# Patient Record
Sex: Female | Born: 1975 | Hispanic: Yes | Marital: Married | Smoking: Never smoker
Health system: Southern US, Community
[De-identification: ages and names within clinical notes are randomized; demographics above are authoritative.]

## PROBLEM LIST (undated history)

## (undated) ENCOUNTER — Inpatient Hospital Stay (HOSPITAL_COMMUNITY): Payer: Self-pay

## (undated) DIAGNOSIS — O24419 Gestational diabetes mellitus in pregnancy, unspecified control: Secondary | ICD-10-CM

---

## 1999-03-30 ENCOUNTER — Ambulatory Visit (HOSPITAL_COMMUNITY): Admission: RE | Admit: 1999-03-30 | Discharge: 1999-03-30 | Payer: Self-pay | Admitting: *Deleted

## 1999-03-30 ENCOUNTER — Encounter: Payer: Self-pay | Admitting: *Deleted

## 1999-06-22 ENCOUNTER — Ambulatory Visit (HOSPITAL_COMMUNITY): Admission: RE | Admit: 1999-06-22 | Discharge: 1999-06-22 | Payer: Self-pay | Admitting: *Deleted

## 1999-08-27 ENCOUNTER — Inpatient Hospital Stay (HOSPITAL_COMMUNITY): Admission: AD | Admit: 1999-08-27 | Discharge: 1999-08-27 | Payer: Self-pay | Admitting: *Deleted

## 1999-10-14 ENCOUNTER — Inpatient Hospital Stay (HOSPITAL_COMMUNITY): Admission: AD | Admit: 1999-10-14 | Discharge: 1999-10-16 | Payer: Self-pay | Admitting: Obstetrics

## 1999-10-15 ENCOUNTER — Encounter: Payer: Self-pay | Admitting: Obstetrics

## 2002-11-10 ENCOUNTER — Encounter: Payer: Self-pay | Admitting: Internal Medicine

## 2002-11-10 ENCOUNTER — Ambulatory Visit (HOSPITAL_COMMUNITY): Admission: RE | Admit: 2002-11-10 | Discharge: 2002-11-10 | Payer: Self-pay | Admitting: Internal Medicine

## 2006-11-26 ENCOUNTER — Inpatient Hospital Stay (HOSPITAL_COMMUNITY): Admission: AD | Admit: 2006-11-26 | Discharge: 2006-11-26 | Payer: Self-pay | Admitting: Obstetrics & Gynecology

## 2007-02-13 ENCOUNTER — Ambulatory Visit (HOSPITAL_COMMUNITY): Admission: RE | Admit: 2007-02-13 | Discharge: 2007-02-13 | Payer: Self-pay | Admitting: Obstetrics & Gynecology

## 2007-07-09 ENCOUNTER — Inpatient Hospital Stay (HOSPITAL_COMMUNITY): Admission: AD | Admit: 2007-07-09 | Discharge: 2007-07-12 | Payer: Self-pay | Admitting: Obstetrics and Gynecology

## 2007-07-09 ENCOUNTER — Ambulatory Visit: Payer: Self-pay | Admitting: Obstetrics and Gynecology

## 2007-07-10 ENCOUNTER — Encounter: Payer: Self-pay | Admitting: Obstetrics and Gynecology

## 2010-12-18 LAB — CBC
HCT: 29.4 — ABNORMAL LOW
HCT: 30.5 — ABNORMAL LOW
Hemoglobin: 10.4 — ABNORMAL LOW
MCHC: 35.6
Platelets: 173
RBC: 3.38 — ABNORMAL LOW
RBC: 3.9
RDW: 14.8
WBC: 10.9 — ABNORMAL HIGH
WBC: 12 — ABNORMAL HIGH

## 2010-12-18 LAB — RPR: RPR Ser Ql: NONREACTIVE

## 2011-01-04 LAB — COMPREHENSIVE METABOLIC PANEL
Alkaline Phosphatase: 27 — ABNORMAL LOW
BUN: 9
Glucose, Bld: 105 — ABNORMAL HIGH
Potassium: 3.8
Total Bilirubin: 0.6
Total Protein: 6.5

## 2011-01-04 LAB — WET PREP, GENITAL

## 2011-01-04 LAB — URINALYSIS, ROUTINE W REFLEX MICROSCOPIC
Glucose, UA: NEGATIVE
Specific Gravity, Urine: 1.025
pH: 6

## 2011-01-04 LAB — CBC
HCT: 34.9 — ABNORMAL LOW
Hemoglobin: 12.5
RDW: 13.3

## 2011-01-04 LAB — URINE CULTURE: Colony Count: 50000

## 2011-01-04 LAB — GC/CHLAMYDIA PROBE AMP, GENITAL
Chlamydia, DNA Probe: NEGATIVE
GC Probe Amp, Genital: NEGATIVE

## 2012-03-25 NOTE — L&D Delivery Note (Signed)
Operative Delivery Note At 9:38 AM a viable female was delivered via Vaginal, Vacuum Investment banker, operational).  Presentation: vertex OT; Position: Left,, Occiput,, Transverse; Station: +2.  Verbal consent: obtained from patient with translator spanish.  Risks and benefits discussed in detail.  Risks include, but are not limited to the risks of anesthesia, bleeding, infection, damage to maternal tissues, fetal cephalhematoma.  There is also the risk of inability to effect vaginal delivery of the head, or shoulder dystocia that cannot be resolved by established maneuvers, leading to the need for emergency cesarean section.  APGAR: 9, 9; weight 7 lb 7.1 oz (3375 g).   Placenta status: Intact, Spontaneous.   Cord: 3 vessels with the following complications: None.    Anesthesia: Epidural  Instruments: Kiwi vacuum Episiotomy: None Lacerations: 2nd degree;Perineal Suture Repair: 3.0 vicryl on CT Est. Blood Loss (mL): 350  VAVD over 2nd degree tear with kiwi. Infant having fetal distress. Reduced cervix. Placed kiwi. 3 pulls with 1 pop off delivered vaginally and crying at perineum. Rectum evaluated with no deffects appreciated. 2nd degree repaired in usual fashion. Pt was hemostatic with EBL at 350. Counts correct. Sharps off field.   Mom to postpartum.  Baby to couplet care with mother.  Jolyn Lent RYAN 01/22/2013, 10:45 AM

## 2012-07-01 ENCOUNTER — Ambulatory Visit: Payer: Self-pay | Admitting: Physician Assistant

## 2012-07-01 VITALS — BP 112/60 | HR 65 | Temp 99.2°F | Resp 16 | Wt 165.0 lb

## 2012-07-01 DIAGNOSIS — N912 Amenorrhea, unspecified: Secondary | ICD-10-CM

## 2012-07-01 LAB — POCT URINE PREGNANCY: Preg Test, Ur: POSITIVE

## 2012-07-01 MED ORDER — PRENATAL VITAMINS 28-0.8 MG PO TABS: 1.0000 | ORAL_TABLET | Freq: Every day | ORAL | Status: DC

## 2012-07-01 NOTE — Patient Instructions (Addendum)
Your pregnancy test is positive today.  I have sent prenatal vitamins to your pharmacy, begin taking these once daily.  Do not smoke.  Do not drink alcohol.  Make sure you are eating plenty of vegetables and fruits and lean meats.  Drink plenty of water.  Establish care with an OB/GYN.  Please let us know if anything comes up in the mean time.   Psychiatrist (Pregnancy) Si planea quedar embarazada, es una buena idea concertar una cita de preconcepcin con el mdico para poder lograr un estilo de vida saludable ante de quedar embarazada. Esto incluye dieta, peso, ejercicio, el tomar vitaminas prenatales en especial cido flico (ayuda a prevenir defectos en el cerebro y la mdula espinal), evitar el alcohol, fumar, las drogas ilegales, problemas mdicos (diabetes, convulsiones), historial familiar de problemas genticos, condiciones de trabajo e inmunizaciones. Es mejor tener conocimiento de estas cosas y Radio producer algo antes de quedar embarazada. Si est embarazada, es necesario que siga ciertas pautas para tener un beb sano. Es muy importante Education officer, environmental controles prenatales adecuados y seguir las indicaciones del profesional que la asiste. La atencin prenatal incluye toda la asistencia mdica que usted recibe antes del nacimiento del beb. Esto ayuda a Publishing copy y [place]. INSTRUCCIONES PARA EL CUIDADO DOMICILIARIO  Comience las consultas prenatales alrededor de la 12 semana de embarazo o lo antes posible. Al principio generalmente se programan cada mes. Se hacen ms frecuentes en los 2 ltimos meses antes del parto. Es importante que concurra a todas las citas con el profesional y siga sus instrucciones con Camera operator a los medicamentos que deba Chemical engineer, a la actividad fsica y a Psychologist, forensic.  Durante el embarazo debe obtener nutrientes para usted y para su beb. Consuma una dieta normal y bien balanceada. Elija alimentos como carne, pescado, [place] y otros productos lcteos,  vegetales, frutas, panes integrales y Research officer, trade union cul es el aumento de [place] ideal, segn su peso y Risk analyst. Beba gran cantidad de lquidos. Trate de beber 8 vasos de lquidos por Futures trader.  El alcohol se asocia a cierto nmero de defectos del nacimiento, incluyendo el sndrome de alcoholismo fetal. Lo mejor es evitarlo completamente El cigarrillo causa nacimientos prematuros y bebs de bajo peso al Psychologist, clinical. El consumo de alcohol y nicotina durante el embarazo tambin aumentan marcadamente la probabilidad de que el nio sea qumicamente dependiente en etapas posteriores de su vida y puede contribuir al sndrome de muerte sbita infantil (SMSI)  No consuma drogas.  Solo tome medicamentos prescriptos o de venta libre que le haya recomendado el profesional. Algunos medicamentos pueden causar problemas genticos y fsicos al beb  Las nuseas matinales pueden aliviarse si come Chiropodist en la cama. Coma dos galletitas antes de levantarse por la maana.  Las relaciones sexuales pueden continuarse hasta casi el final del embarazo, si no se presentan otros problemas como prdida prematura (antes de tiempo) de lquido amnitico, Restaurant manager, fast food vaginal, dolor durante las relaciones sexuales o dolor abdominal (en el vientre).  Practique ejercicios con regularidad. Consulte con el profesional que la asiste si no sabe con certeza si determinados ejercicios son seguros.  No utilice la baera con agua caliente, baos turcos y saunas. Estos aumentan el riesgo de sufrir un desmayo o de prdida del conocimiento, y as Runner, broadcasting/film/video usted o el beb. La natacin es un buen ejercicio. Descanse todo lo que pueda e incluya una siesta despus de almorzar siempre que le sea posible, especialmente durante el tercer trimestre.  Evite los olores y las sustancias qumicas txicas.  No use zapatos de tacones altos, podra perder el equilibrio y caer.  No levante objetos de ms de 2,5 kg.  Si levanta un objeto, flexione las piernas y los muslos, y no la espalda.  Evite los viajes largos, Paramedic trimestre.  Si debe viajar fuera de la ciudad o de su estado, lleve una copia de la historia clnica. SOLICITE ATENCIN MDICA DE INMEDIATO SI:  La temperatura oral se eleva sin motivo por encima de 102 F (38.9 C) o segn le indique el profesional que lo asiste.  Tiene una prdida de lquido por la vagina. Si sospecha una ruptura de las [place], tmese la temperatura y llame al profesional para informarlo sobre esto.  Observa unas pequeas manchas o una hemorragia vaginal Notifique al profesional acerca de la cantidad y de cuntos apsitos est utilizando.  Contina teniendo nuseas y no obtiene alivio de los [employer] han [place], o vomita sangre o una sustancia similar a la borra del caf.  Presenta un dolor en la zona superior del abdomen.  Siente molestias en el ligamento redondo en la parte abdominal baja. El profesional que la asiste Hydrologist.  Siente pequeas contracciones del tero (matriz)  No siente que el beb se mueve, o percibe menos movimientos que antes.  Siente dolor al [employer].  Brett Fairy hemorragia vaginal anormal.  Tiene diarrea persistente.  Sufre una cefalea grave.  Tiene problemas visuales.  Comienza a sentir debilidad muscular.  Se siente mareada o sufre un desmayo.  Comienza a sentir falta de aire.  Siente dolor en el pecho.  Sufre dolor en la espalda que se irradia hacia la pierna y el pie.  Siente latidos cardacos irregulares o la frecuencia cardaca es muy rpida.  Aumenta excesivamente de peso en un perodo breve (2,5 kg en 3 a 5 das)  Se ve envuelta en una situacin de violencia domstica. Document Released: 12/19/2004 Document Revised: 06/03/2011 [facility] Patient Information 2013 [place], [place].

## 2012-07-01 NOTE — Progress Notes (Signed)
  Subjective:    Patient ID: Kirsten Sanchez, female    DOB: [birth year], 37 y.o.   MRN: [id number]  HPI   Kirsten Sanchez is a 37 yr old female here desiring pregnancy test.  Pt speaks little english, Fonda Kinder (clerical team member) assisted with translation.  Pt states she took a home preg test about 3 weeks ago, but the line was not very clear, so she does not know if it was positive or negative.  States she was not trying to get pregnant, but was also not using contraception.  Chart states pt has IUD, but pt states this is incorrect.  She had Implanon but this was removed 1 yr ago.  Has had abnormal periods since removal of Implanon.  Does not know LMP, maybe January.  Has felt very fatigued and had nausea but no vomiting for the last 4 weeks.  She has three children.  No OB/GYN.  Trying to establish at a clinic but needs a positive pregnancy test for this.  No smoking or etoh.  Not taking prenatal vitamins.   Review of Systems  Constitutional: Negative.   HENT: Negative.   Respiratory: Negative.   Cardiovascular: Negative.   Gastrointestinal: Positive for nausea. Negative for vomiting.  Genitourinary: Positive for menstrual problem (amenorrhea). Negative for vaginal bleeding and vaginal discharge.  Musculoskeletal: Negative.   Skin: Negative.   Neurological: Negative.        Objective:   Physical Exam  Vitals reviewed. Constitutional: She is oriented to person, place, and time. She appears well-developed and well-nourished. No distress.  HENT:  Head: Normocephalic and atraumatic.  Eyes: Conjunctivae are normal. No scleral icterus.  Cardiovascular: Normal rate, regular rhythm and normal heart sounds.  Exam reveals no gallop and no friction rub.   No murmur heard. Pulmonary/Chest: Effort normal and breath sounds normal. She has no wheezes. She has no rales.  Abdominal: Soft. Bowel sounds are normal. There is no tenderness.  Neurological: She is alert and oriented to person, place,  and time.  Skin: Skin is warm and dry.  Psychiatric: She has a normal mood and affect.     Filed Vitals:   07/01/12 1139  BP: 112/60  Pulse: 65  Temp: 99.2 F (37.3 C)  Resp: 16     Results for orders placed in visit on 07/01/12  POCT URINE PREGNANCY      Result Value Range   Preg Test, Ur Positive         Assessment & Plan:  Amenorrhea - Plan: POCT urine pregnancy, Prenatal Vit-Fe Fumarate-FA (PRENATAL VITAMINS) 28-0.8 MG TABS   Kirsten Sanchez is a 37 yr old female with amenorrhea.  Urine HCG is positive today.  Pt is trying to establish with OB/GYN, but needed pregnancy test to do so.  Discussed avoidance of tobacco and etoh.  Encouraged healthy diet and hydration.  Will start prenatal vitamins today.  If concerns arise prior to ob/gyn appt pt will RTC.

## 2012-07-06 ENCOUNTER — Other Ambulatory Visit: Payer: Self-pay

## 2012-07-06 DIAGNOSIS — Z3201 Encounter for pregnancy test, result positive: Secondary | ICD-10-CM

## 2012-07-06 DIAGNOSIS — O3680X0 Pregnancy with inconclusive fetal viability, not applicable or unspecified: Secondary | ICD-10-CM

## 2012-07-07 LAB — OBSTETRIC PANEL
Basophils Absolute: 0 10*3/uL (ref 0.0–0.1)
HCT: 34.3 % — ABNORMAL LOW (ref 36.0–46.0)
Hepatitis B Surface Ag: NEGATIVE
Lymphocytes Relative: 23 % (ref 12–46)
Neutro Abs: 5.8 10*3/uL (ref 1.7–7.7)
Platelets: 204 10*3/uL (ref 150–400)
RDW: 14.3 % (ref 11.5–15.5)
Rubella: 7.47 Index — ABNORMAL HIGH (ref <0.90)
WBC: 8.6 10*3/uL (ref 4.0–10.5)

## 2012-07-08 LAB — HEMOGLOBINOPATHY EVALUATION
Hgb F Quant: 0 % (ref 0.0–2.0)
Hgb S Quant: 0 %

## 2012-07-14 ENCOUNTER — Ambulatory Visit (HOSPITAL_COMMUNITY)
Admission: RE | Admit: 2012-07-14 | Discharge: 2012-07-14 | Disposition: A | Discharge status: Home / Self Care | Payer: Self-pay | Source: Ambulatory Visit | Attending: Obstetrics and Gynecology | Admitting: Obstetrics and Gynecology

## 2012-07-14 ENCOUNTER — Encounter (HOSPITAL_COMMUNITY): Payer: Self-pay

## 2012-07-14 DIAGNOSIS — O3680X0 Pregnancy with inconclusive fetal viability, not applicable or unspecified: Secondary | ICD-10-CM

## 2012-07-14 DIAGNOSIS — Z3689 Encounter for other specified antenatal screening: Secondary | ICD-10-CM | POA: Insufficient documentation

## 2012-07-29 ENCOUNTER — Other Ambulatory Visit: Payer: Self-pay | Admitting: Family Medicine

## 2012-07-29 ENCOUNTER — Encounter: Payer: Self-pay | Admitting: Family Medicine

## 2012-07-29 ENCOUNTER — Ambulatory Visit (INDEPENDENT_AMBULATORY_CARE_PROVIDER_SITE_OTHER): Payer: Medicaid Other | Admitting: Family Medicine

## 2012-07-29 VITALS — BP 111/56 | Temp 98.7°F | Ht 59.0 in | Wt 159.6 lb

## 2012-07-29 DIAGNOSIS — Z3492 Encounter for supervision of normal pregnancy, unspecified, second trimester: Secondary | ICD-10-CM

## 2012-07-29 DIAGNOSIS — O09529 Supervision of elderly multigravida, unspecified trimester: Secondary | ICD-10-CM

## 2012-07-29 DIAGNOSIS — O0993 Supervision of high risk pregnancy, unspecified, third trimester: Secondary | ICD-10-CM | POA: Insufficient documentation

## 2012-07-29 LAB — OB RESULTS CONSOLE GC/CHLAMYDIA: Chlamydia: NEGATIVE

## 2012-07-29 MED ORDER — DOCUSATE SODIUM 100 MG PO CAPS: 100.0000 mg | ORAL_CAPSULE | Freq: Two times a day (BID) | ORAL | Status: DC | PRN

## 2012-07-29 NOTE — Patient Instructions (Signed)
Embarazo - Segundo trimestre (Pregnancy - Second Trimester) El segundo trimestre del embarazo (del 3 al 6mes) es un perodo de evolucin rpida para usted y el beb. Hacia el final del sexto mes, el beb mide aproximadamente 23 cm y pesa 680 g. Comenzar a sentir los movimientos del beb entre las 18 y las 20 semanas de embarazo. Podr sentir las pataditas ("quickening en ingls"). Hay un rpido aumento de peso. Puede segregar un lquido claro (calostro) de las mamas. Quizs sienta pequeas contracciones en el vientre (tero) Esto se conoce como falso trabajo de parto o contracciones de Braxton-Hicks. Es como una prctica del trabajo de parto que se produce cuando el beb est listo para salir. Generalmente los problemas de vmitos matinales ya se han superado hacia el final del primer trimestre. Algunas mujeres desarrollan pequeas manchas oscuras (que se denominan cloasma, mscara del embarazo) en la cara que normalmente se van luego del nacimiento del beb. La exposicin al sol empeora las manchas. Puede desarrollarse acn en algunas mujeres embarazadas, y puede desaparecer en aquellas que ya tienen acn. EXAMENES PRENATALES  Durante los exmenes prenatales, deber seguir realizando pruebas de sangre, segn avance el embarazo. Estas pruebas se realizan para controlar su salud y la del beb. Tambin se realizan anlisis de sangre para conocer los niveles de hemoglobina. La anemia (bajo nivel de hemoglobina) es frecuente durante el embarazo. Para prevenirla, se administran hierro y vitaminas. Tambin se le realizarn exmenes para saber si tiene diabetes entre las 24 y las 28 semanas del embarazo. Podrn repetirle algunas de las pruebas que le hicieron previamente.  En cada visita le medirn el tamao del tero. Esto se realiza para asegurarse de que el beb est creciendo correctamente de acuerdo al estado del embarazo.  Tambin en cada visita prenatal controlarn su presin arterial. Esto se realiza  para asegurarse de que no tenga toxemia.  Se controlar su orina para asegurarse de que no tenga infecciones, diabetes o protena en la orina.  Se controlar su peso regularmente para asegurarse que el aumento ocurre al ritmo indicado. Esto se hace para asegurarse que usted y el beb tienen una evolucin normal.  En algunas ocasiones se realiza una prueba de ultrasonido para confirmar el correcto desarrollo y evolucin del beb. Esta prueba se realiza con ondas sonoras inofensivas para el beb, de modo que el profesional pueda calcular ms precisamente la fecha del parto. Algunas veces se realizan pruebas especializadas del lquido amnitico que rodea al beb. Esta prueba se denomina amniocentesis. El lquido amnitico se obtiene introduciendo una aguja en el vientre (abdomen). Se realiza para controlar los cromosomas en aquellos casos en los que existe alguna preocupacin acerca de algn problema gentico que pueda sufrir el beb. En ocasiones se lleva a cabo cerca del final del embarazo, si es necesario inducir al parto. En este caso se realiza para asegurarse que los pulmones del beb estn lo suficientemente maduros como para que pueda vivir fuera del tero. CAMBIOS QUE OCURREN EN EL SEGUNDO TRIMESTRE DEL EMBARAZO Su organismo atravesar numerosos cambios durante el embarazo. Estos pueden variar de una persona a otra. Converse con el profesional que la asiste acerca los cambios que usted note y que la preocupen.  Durante el segundo trimestre probablemente sienta un aumento del apetito. Es normal tener "antojos" de ciertas comidas. Esto vara de una persona a otra y de un embarazo a otro.  El abdomen inferior comenzar a abultarse.  Podr tener la necesidad de orinar con ms frecuencia debido a que   el tero y el beb presionan sobre la vejiga. Tambin es frecuente contraer ms infecciones urinarias durante el embarazo (dolor al orinar). Puede evitarlas bebiendo gran cantidad de lquidos y vaciando  la vejiga antes y despus de mantener relaciones sexuales.  Podrn aparecer las primeras estras en las caderas, abdomen y mamas. Estos son cambios normales del cuerpo durante el embarazo. No existen medicamentos ni ejercicios que puedan prevenir estos cambios.  Es posible que comience a desarrollar venas inflamadas y abultadas (varices) en las piernas. El uso de medias de descanso, elevar sus pies durante 15 minutos, 3 a 4 veces al da y limitar la sal en su dieta ayuda a aliviar el problema.  Podr sentir acidez gstrica a medida que el tero crece y presiona contra el estmago. Puede tomar anticidos, con la autorizacin de su mdico, para aliviar este problema. Tambin es til ingerir pequeas comidas 4 a 5 veces al da.  La constipacin puede tratarse con un laxante o agregando fibra a su dieta. Beber grandes cantidades de lquidos, comer vegetales, frutas y granos integrales es de gran ayuda.  Tambin es beneficioso practicar actividad fsica. Si ha sido una persona activa hasta el embarazo, podr continuar con la mayora de las actividades durante el mismo. Si ha sido menos activa, puede ser beneficioso que comience con un programa de ejercicios, como realizar caminatas.  Puede desarrollar hemorroides (vrices en el recto) hacia el final del segundo trimestre. Tomar baos de asiento tibios y utilizar cremas recomendadas por el profesional que lo asiste sern de ayuda para los problemas de hemorroides.  Tambin podr sentir dolor de espalda durante este momento de su embarazo. Evite levantar objetos pesados, utilice zapatos de taco bajo y mantenga una buena postura para ayudar a reducir los problemas de espalda.  Algunas mujeres embarazadas desarrollan hormigueo y adormecimiento de la mano y los dedos debido a la hinchazn y compresin de los ligamentos de la mueca (sndrome del tnel carpiano). Esto desaparece una vez que el beb nace.  Como sus pechos se agrandan, necesitar un sujetador  ms grande. Use un sostn de soporte, cmodo y de algodn. No utilice un sostn para amamantar hasta el ltimo mes de embarazo si va a amamantar al beb.  Podr observar una lnea oscura desde el ombligo hacia la zona pbica denominada linea nigra.  Podr observar que sus mejillas se ponen coloradas debido al aumento de flujo sanguneo en la cara.  Podr desarrollar "araitas" en la cara, cuello y pecho. Esto desaparece una vez que el beb nace. INSTRUCCIONES PARA EL CUIDADO DOMICILIARIO  Es extremadamente importante que evite el cigarrillo, hierbas medicinales, alcohol y las drogas no prescriptas durante el embarazo. Estas sustancias qumicas afectan la formacin y el desarrollo del beb. Evite estas sustancias durante todo el embarazo para asegurar el nacimiento de un beb sano.  La mayor parte de los cuidados que se aconsejan son los mismos que los indicados para el primer trimestre del embarazo. Cumpla con las citas tal como se le indic. Siga las instrucciones del profesional que lo asiste con respecto al uso de los medicamentos, el ejercicio y la dieta.  Durante el embarazo debe obtener nutrientes para usted y para su beb. Consuma alimentos balanceados a intervalos regulares. Elija alimentos como carne, pescado, leche y otros productos lcteos descremados, vegetales, frutas, panes integrales y cereales. El profesional le informar cul es el aumento de peso ideal.  Las relaciones sexuales fsicas pueden continuarse hasta cerca del fin del embarazo si no existen otros problemas. Estos   problemas pueden ser la prdida temprana (prematura) de lquido amnitico de las membranas, sangrado vaginal, dolor abdominal u otros problemas mdicos o del embarazo.  Realice actividad fsica todos los das, si no tiene restricciones. Consulte con el profesional que la asiste si no sabe con certeza si determinados ejercicios son seguros. El mayor aumento de peso tiene lugar durante los ltimos 2 trimestres del  embarazo. El ejercicio la ayudar a:  Controlar su peso.  Ponerla en forma para el parto.  Ayudarla a perder peso luego de haber dado a luz.  Use un buen sostn o como los que se usan para hacer deportes para aliviar la sensibilidad de las mamas. Tambin puede serle til si lo usa mientras duerme. Si pierde calostro, podr utilizar apsitos en el sostn.  No utilice la baera con agua caliente, baos turcos y saunas durante el embarazo.  Utilice el cinturn de seguridad sin excepcin cuando conduzca. Este la proteger a usted y al beb en caso de accidente.  Evite comer carne cruda, queso crudo, y el contacto con los utensilios y desperdicios de los gatos. Estos elementos contienen grmenes que pueden causar defectos de nacimiento en el beb.  El segundo trimestre es un buen momento para visitar a su dentista y evaluar su salud dental si an no lo ha hecho. Es importante mantener los dientes limpios. Utilice un cepillo de dientes blando. Cepllese ms suavemente durante el embarazo.  Es ms fcil perder algo de orina durante el embarazo. Apretar y fortalecer los msculos de la pelvis la ayudar con este problema. Practique detener la miccin cuando est en el bao. Estos son los mismos msculos que necesita fortalecer. Son tambin los mismos msculos que utiliza cuando trata de evitar los gases. Puede practicar apretando estos msculos 10 veces, y repetir esto 3 veces por da aproximadamente. Una vez que conozca qu msculos debe apretar, no realice estos ejercicios durante la miccin. Puede favorecerle una infeccin si la orina vuelve hacia atrs.  Pida ayuda si tiene necesidades econmicas, de asesoramiento o nutricionales durante el embarazo. El profesional podr ayudarla con respecto a estas necesidades, o derivarla a otros especialistas.  La piel puede ponerse grasa. Si esto sucede, lvese la cara con un jabn suave, utilice un humectante no graso y maquillaje con base de aceite o  crema. CONSUMO DE MEDICAMENTOS Y DROGAS DURANTE EL EMBARAZO  Contine tomando las vitaminas apropiadas para esta etapa tal como se le indic. Las vitaminas deben contener un miligramo de cido flico y deben suplementarse con hierro. Guarde todas las vitaminas fuera del alcance de los nios. La ingestin de slo un par de vitaminas o tabletas que contengan hierro puede ocasionar la muerte en un beb o en un nio pequeo.  Evite el uso de medicamentos, inclusive los de venta libre y hierbas que no hayan sido prescriptos o indicados por el profesional que la asiste. Algunos medicamentos pueden causar problemas fsicos al beb. Utilice los medicamentos de venta libre o de prescripcin para el dolor, el malestar o la fiebre, segn se lo indique el profesional que lo asiste. No utilice aspirina.  El consumo de alcohol est relacionado con ciertos defectos de nacimiento. Esto incluye el sndrome de alcoholismo fetal. Debe evitar el consumo de alcohol en cualquiera de sus formas. El cigarrillo causa nacimientos prematuros y bebs de bajo peso. El uso de drogas recreativas est absolutamente prohibido. Son muy nocivas para el beb. Un beb que nace de una madre adicta, ser adicto al nacer. Ese beb tendr los mismos   sntomas de abstinencia que un adulto.  Infrmele al profesional si consume alguna droga.  No consuma drogas ilegales. Pueden causarle mucho dao al beb. SOLICITE ATENCIN MDICA SI: Tiene preguntas o preocupaciones durante su embarazo. Es mejor que llame para consultar las dudas que esperar hasta su prxima visita prenatal. De esta forma se sentir ms tranquila.  SOLICITE ATENCIN MDICA DE INMEDIATO SI:  La temperatura oral se eleva sin motivo por encima de 102 F (38.9 C) o segn le indique el profesional que lo asiste.  Tiene una prdida de lquido por la vagina (canal de parto). Si sospecha una ruptura de las membranas, tmese la temperatura y llame al profesional para informarlo sobre  esto.  Observa unas pequeas manchas, una hemorragia vaginal o elimina cogulos. Notifique al profesional acerca de la cantidad y de cuntos apsitos est utilizando. Unas pequeas manchas de sangre son algo comn durante el embarazo, especialmente despus de mantener relaciones sexuales.  Presenta un olor desagradable en la secrecin vaginal y observa un cambio en el color, de transparente a blanco.  Contina con las nuseas y no obtiene alivio de los remedios indicados. Vomita sangre o algo similar a la borra del caf.  [place] o sube ms de 900 g. en una semana, o segn lo indicado por el profesional que la asiste.  Observa que se le hinchan el rostro, las manos, los pies o las piernas.  Ha estado expuesta a la rubola y no ha sufrido la enfermedad.  Ha estado expuesta a la quinta enfermedad o a la varicela.  Presenta dolor abdominal. Las molestias en el ligamento redondo son una causa no cancerosa (benigna) frecuente de dolor abdominal durante el embarazo. El profesional que la asiste deber evaluarla.  Presenta dolor de cabeza intenso que no se alivia.  Presenta fiebre, diarrea, dolor al orinar o le falta la respiracin.  Presenta dificultad para ver, visin borrosa, o visin doble.  Sufre una cada, un accidente de trnsito o cualquier tipo de trauma.  Vive en un hogar en el que existe violencia fsica o mental. Document Released: 12/19/2004 Document Revised: 06/03/2011 [employer] Patient Information 2013 [employer].  Lactancia materna  (Breastfeeding) Decidir amamantar es una de las mejores elecciones que puede hacer por usted y su beb. La informacin que se brinda a continuacin le dar una breve visin de los beneficios de la lactancia materna as como de las dudas ms frecuentes alrededor de ella.  LOS BENEFICIOS DE AMAMANTAR  Para el beb   La primera leche (calostro ) ayuda al mejor funcionamiento del sistema digestivo del beb.   La leche tiene anticuerpos que  provienen de la madre y que ayudan a prevenir las infecciones en el beb.   El beb tiene una menor incidencia de asma, alergias y del sndrome de muerte sbita del lactante (SMSL).   Los nutrientes en la leche materna son mejores para el beb que los preparados para lactantes y la leche materna ayuda a un mejor desarrollo del cerebro del beb.   Los bebs amamantados tienen menos gases, clicos y estreimiento.  Para la mam   La lactancia materna favorece el desarrollo de un vnculo muy especial entre la madre y el beb.   Es ms conveniente, siempre disponible y a la temperatura adecuada y econmico.   Consume caloras en la madre y la ayuda a perder el peso ganado durante el embarazo.   Favorece la contraccin del tero a su tamao normal, de manera ms rpida y disminuye las hemorragias luego del   parto.   Las madres que amamantan tienen menor riesgo de desarrollar cncer de mama.  FRECUENCIA DEL AMAMANTAMIENTO   Un beb sano, nacido a trmino, puede amamantarse con tanta frecuencia como cada hora, o espaciar las comidas cada tres horas.   Observe al beb cuando manifieste signos de hambre. Amamante a su beb si muestra signos de hambre. Esta frecuencia variar de un beb a otro.   Amamntelo tan seguido como el beb lo solicite, o cuando usted sienta la necesidad de aliviar sus mamas.   Despierte al beb si han pasado 3  4 horas desde la ltima comida.   El amamantamiento frecuente la ayudar a producir ms leche y a prevenir problemas de dolor en los pezones e hinchazn de las mamas.  POSICIN DEL BEBE PARA EL AMAMANTAMIENTO   Ya sea que se encuentre acostada o sentada, asegrese que el abdomen del beb enfrente el suyo.   Sostenga la mama con el pulgar por arriba y los otros 4 dedos por debajo. Asegrese que sus dedos se encuentren lejos del pezn y de la boca del beb.   Empuje suavemente los labios del beb con el pezn o con el dedo.   Cuando la boca  del beb se abra lo suficiente, introduzca el pezn y la areola tanto como le sea posible dentro de la boca.   Coloque al beb cerca suyo de modo que su nariz y mejillas toquen las mamas al mamar.  ALIMENTACIN Y SUCCIN   La duracin de cada comida vara de un beb a otro y de una comida a otra.   El beb debe succionar entre 2 y 3 minutos para que le llegue leche. Esto se denomina "bajada". Por este motivo, permita que el nio se alimente en cada mama todo lo que desee. Terminar de mamar cuando haya recibido la cantidad adecuada de nutrientes.   Para detener la succin coloque su dedo en la comisura de la boca del nio y deslcelo entre sus encas antes de quitarle la mama de la boca. Esto la ayudar a evitar el dolor en los pezones.  COMO SABER SI EL BEB OBTIENE LA SUFICIENTE LECHE MATERNA  Preguntarse si el beb obtiene la cantidad suficiente de leche es una preocupacin frecuente entre las madres. Puede asegurarse que el beb tiene la leche suficiente si:   El beb succiona activamente y usted escucha que traga .   El beb parece estar relajado y satisfecho despus de mamar.   El nio se alimenta al menos 8 a 12 veces en 24 horas. Alimntelo hasta que se desprenda por sus propios medios o se quede dormido en la primera mama (al menos durante 10 a 20 minutos), luego ofrzcale el otro lado.   El beb moja 5 a 6 paales desechables (6 a 8 paales de tela) en 24 horas cuando tiene 5  6 das de vida.   Tiene al menos 3 a 4 deposiciones todos los das en los primeros meses. La materia fecal debe ser blanda y amarillenta.   El beb debe aumentar 4 a 6 libras (120 a 170 gr.) por semana despus de los 4 das de vida.   Siente que las mamas se ablandan despus de amamantar  REDUCIR LA CONGESTIN DE LAS MAMAS   Durante la primera semana despus del parto, usted puede experimentar hinchazn en las mamas. Cuando las mamas estn congestionadas, se sienten calientes, llenas y  molestas al tacto. Puede reducir la congestin si:   Lo amamanta frecuentemente, cada 2-3   horas. Las mams que amamantan pronto y con frecuencia tienen menos problemas de congestin.   Coloque compresas de hielo en sus mamas durante 10-20 minutos entre cada amamantamiento. Esto ayuda a reducir la hinchazn. Envuelva las bolsas de hielo en una toalla liviana para proteger su piel. Las bolsas de vegetales congelados funcionan bien para este propsito.   Tome una ducha tibia o aplique compresas hmedas calientes en las mamas durante 5 a 10 minutos antes de cada vez que amamanta. Esto aumenta la circulacin y ayuda a que la leche fluya.   Masajee suavemente la mama antes y durante la alimentacin. Con las puntas de los dedos, masajee desde la pared torcica hacia abajo hasta llegar al pezn, con movimientos circulares.   Asegrese que el nio vaca al menos una mama antes de cambiar de lado.   Use un sacaleche para vaciar la mama si el beb se duerme o no se alimenta bien. Tambin podr quitarse la leche con esa bomba si tiene que volver al trabajo o siente que las mamas estn congestionadas.   Evite los biberones, chupetes o complementar la alimentacin con agua o jugos en lugar de la leche materna. La leche materna es todo el alimento que el beb necesita. No es necesario que el nio ingiera agua o preparados de bibern. De hecho, es lo mejor para ayudar a que las mamas produzcan ms leche. no darle suplementos al nio durante las primeras semanas.   Verifique que el beb se encuentra en la posicin correcta mientras lo alimenta.   Use un sostn que soporte bien sus mamas y evite los que tienen aro.   Consuma una dieta balanceada y beba lquidos en cantidad.   Descanse con frecuencia, reljese y tome sus vitaminas prenatales para evitar la fatiga, el estrs y la anemia.  Si sigue estas indicaciones, la congestin debe mejorar en 24 a 48 horas. Si an tiene dificultades, consulte a su  asesor en lactancia.  CUDESE USTED MISMA  Cuide sus mamas.   Bese o dchese diariamente.   Evite usar jabn en los pezones.   Comience a amamantar del lado izquierdo en una comida y del lado derecho en la siguiente.   Notar que aumenta el flujo de leche a los 2 a 5 das despus del parto. Puede sentir algunas molestias por la congestin, lo que hace que sus mamas estn duras y sensibles. La congestin disminuye en 24 a 48 horas. Mientras tanto, aplique toallas hmedas calientes durante 5 a 10 minutos antes de amamantar. Un masaje suave y la extraccin de un poco de leche antes de amamantar ablandarn las mamas y har ms fcil que el beb se agarre.   Use un buen sostn y seque al aire los pezones durante 3 a 4 minutos luego de cada alimentacin.   Solo utilice apsitos de algodn.   Utilice lanolina pura sobre los pezones luego de amamantar. No necesita lavarlos luego de alimentar al nio. Otra opcin es exprimir algunas gotas de leche y masajear suavemente los pezones.  Cumpla con estos cuidados   Consuma alimentos bien balanceados y refrigerios nutritivos.   Beba leche, jugos de fruta y agua para satisfacer la sed (alrededor de 8 vasos por da).   Descanse lo suficiente.  Evite los alimentos que usted note que pueden afectar al beb.  SOLICITE ATENCIN MDICA SI:   Tiene dificultad con la lactancia materna y necesita ayuda.   Tiene una zona de color rojo, dura y dolorosa en la mama que se acompaa   de fiebre.   El beb est muy somnoliento como para alimentarse bien o tiene problemas para dormir.   Su beb moja menos de 6 paales al da, a los 5 das de vida.   La piel del beb o la parte blanca de sus ojos est ms amarilla de lo que estaba en el hospital.   Se siente deprimida.  Document Released: 03/11/2005 Document Revised: 09/10/2011 [employer] Patient Information 2013 [employer].  

## 2012-07-29 NOTE — Progress Notes (Signed)
  Subjective:    Kirsten Sanchez is being seen today for her first obstetrical visit.  This is not a planned pregnancy. She is at [redacted]w[redacted]d gestation by 11 week ultrasound. Her obstetrical history is significant for advanced maternal age. Relationship with FOB: spouse, living together. Patient does intend to breast feed. Pregnancy history fully reviewed.  No bleeding or cramping today. Does have occasional morning sickness but is improving. No fever, chills, dysuria, vaginal discharge or diarrhea. Does have constipation. Previous UTI in pregnancy. No history STDs or HSV. Pt states she has a recent pap smear that was negative.  Menstrual History: OB History   Grav Para Term Preterm Abortions TAB SAB Ect Mult Living   4 3 3  0 0 0 0 0 0 3      No LMP recorded. Patient is pregnant.    I have reviewed past medical and surgical history, OB history, imaging, labs, social and family history, medications and allergies.  Review of Systems  Pertinent positives and negatives mentioned in HPI.   Objective:   GEN:  WNWD, no distress HEENT:  NCAT, EOMI, conjunctiva clear NECK:  Supple, non-tender, no thyromegaly, trachea midline CV: RRR, no murmur RESP:  CTAB ABD:  Soft, non-tender, no guarding or rebound, normal bowel sounds EXTREM:  Warm, well perfused, no edema or tenderness NEURO:  Alert, oriented, no focal deficits GU:  Normal external genitalia and vagina. Normal cervix. Minimal vaginal discharge. No CMT. No adnexal tenderness.    Assessment:    Pregnancy at 13w 6d AMA    Plan:    Initial labs drawn including early 1 hour GTT Prenatal vitamins. Problem list reviewed and updated. AFP3 discussed: requested - will get quad screen at 16-18 weeks. Role of ultrasound in pregnancy discussed; fetal survey: requested. Amniocentesis discussed: not indicated. Follow up in 4 weeks. 50% of 45 min visit spent on counseling and coordination of care.

## 2012-07-29 NOTE — Progress Notes (Signed)
[id number] Patient reports feeling very tired and sick throughout all of pregnancies and having issues with weight gain

## 2012-07-30 LAB — GLUCOSE TOLERANCE, 1 HOUR (50G) W/O FASTING: Glucose, 1 Hour GTT: 118 mg/dL (ref 70–140)

## 2012-07-31 ENCOUNTER — Encounter: Payer: Self-pay | Admitting: Family Medicine

## 2012-07-31 LAB — CULTURE, OB URINE: Colony Count: 10000

## 2012-08-26 ENCOUNTER — Encounter: Payer: Self-pay | Admitting: Obstetrics and Gynecology

## 2012-08-26 ENCOUNTER — Ambulatory Visit (INDEPENDENT_AMBULATORY_CARE_PROVIDER_SITE_OTHER): Payer: Self-pay | Admitting: Obstetrics and Gynecology

## 2012-08-26 VITALS — BP 95/58 | Temp 99.1°F | Wt 163.2 lb

## 2012-08-26 DIAGNOSIS — Z3480 Encounter for supervision of other normal pregnancy, unspecified trimester: Secondary | ICD-10-CM

## 2012-08-26 DIAGNOSIS — Z3492 Encounter for supervision of normal pregnancy, unspecified, second trimester: Secondary | ICD-10-CM

## 2012-08-26 LAB — POCT URINALYSIS DIP (DEVICE)
Bilirubin Urine: NEGATIVE
Hgb urine dipstick: NEGATIVE
Ketones, ur: NEGATIVE mg/dL
pH: 7 (ref 5.0–8.0)

## 2012-08-26 NOTE — Patient Instructions (Addendum)
Embarazo - Segundo trimestre (Pregnancy - Second Trimester) El segundo trimestre del Psychiatrist (del 3 al 6 mes) es un perodo de evolucin rpida para usted y el beb. Hacia el final del sexto mes, el beb mide aproximadamente 23 cm y pesa 680 g. Comenzar a Pharmacologist del beb [place] 18 y las 20 [address] de [place]. Podr sentir las pataditas ("quickening en ingls"). Hay un rpido [employer]. Puede segregar un lquido claro Charity fundraiser) de las [place]. Quizs sienta pequeas contracciones en el vientre (tero) Esto se conoce como falso trabajo de parto o contracciones de Braxton-Hicks. Es como una prctica del trabajo de parto que se produce cuando el beb est listo para salir. Generalmente los problemas de vmitos matinales ya se han superado hacia el final del Medical sales representative. Algunas mujeres desarrollan pequeas manchas oscuras (que se denominan cloasma, mscara del embarazo) en la cara que normalmente se van luego del nacimiento del beb. La exposicin al sol empeora las manchas. Puede desarrollarse acn en algunas mujeres embarazadas, y puede desaparecer en aquellas que ya tienen acn. EXAMENES PRENATALES  Durante los [employer], deber seguir realizando pruebas de [place], segn avance el [place]. Estas pruebas se realizan para controlar su salud y la del beb. Tambin se realizan anlisis de sangre para [employer] niveles de [place]. La anemia (bajo nivel de hemoglobina) es frecuente durante el embarazo. Para prevenirla, se administran hierro y vitaminas. Tambin se le realizarn exmenes para saber si tiene diabetes entre las 24 y las 28 semanas del [place]. Podrn repetirle algunas de las [employer] hicieron previamente.  En cada visita le medirn el tamao del tero. Esto se realiza para asegurarse de que el beb est creciendo correctamente de acuerdo al estado del [place].  Tambin en cada visita prenatal controlarn su presin arterial. Esto se realiza  para asegurarse de que no tenga toxemia.  Se controlar su orina para asegurarse de que no tenga infecciones, diabetes o protena en la orina.  Se controlar su peso regularmente para asegurarse que el aumento ocurre al ritmo indicado. Esto se hace para asegurarse que usted y el beb tienen una evolucin normal.  En algunas ocasiones se realiza una prueba de ultrasonido para confirmar el correcto desarrollo y evolucin del beb. Esta prueba se realiza con ondas sonoras inofensivas para el beb, de modo que el profesional pueda calcular ms precisamente la fecha del [place]. Algunas veces se realizan pruebas especializadas del lquido amnitico que rodea al beb. Esta prueba se denomina amniocentesis. El lquido amnitico se obtiene introduciendo una aguja en el vientre (abdomen). Se realiza para Conservator, museum/gallery en los que existe alguna preocupacin acerca de algn problema gentico que pueda sufrir el beb. En ocasiones se lleva a cabo cerca del final del embarazo, si es necesario inducir al [employer]. En este caso se realiza para asegurarse que los pulmones del beb estn lo suficientemente maduros como para que pueda vivir fuera del tero. CAMBIOS QUE OCURREN EN EL SEGUNDO TRIMESTRE DEL EMBARAZO Su organismo atravesar numerosos cambios durante el [employer]. Estos pueden variar de Neomia Dear persona a otra. [place] con el profesional que la asiste acerca los cambios que usted note y que la preocupen.  Durante el segundo trimestre probablemente sienta un aumento del apetito. Es normal tener "antojos" de Development worker, community. Esto vara de Neomia Dear persona a otra y de un embarazo a Therapist, art.  El abdomen inferior comenzar a abultarse.  Podr tener la necesidad de Geographical information systems officer con ms frecuencia debido a  que el tero y el beb presionan sobre la vejiga. Tambin es frecuente contraer ms infecciones urinarias durante el [employer]. Puede evitarlas bebiendo gran cantidad de lquidos y vaciando la vejiga antes y  despus de Sales promotion account executive.  Podrn aparecer las primeras estras en las caderas, abdomen y [place]. Estos son cambios normales del cuerpo durante el [place]. No existen medicamentos ni ejercicios que puedan prevenir [employer].  Es posible que comience a desarrollar venas inflamadas y abultadas (varices) en las piernas. El uso de medias de descanso, Optometrist sus pies durante 15 minutos, 3 a 4 veces al da y Film/video editor la sal en su dieta ayuda a Journalist, newspaper.  Podr sentir Engineering geologist gstrica a medida que el tero crece y Doctor, general practice. Puede tomar anticidos, con la autorizacin de su mdico, para Financial planner. Tambin es til ingerir pequeas comidas 4 a 5 veces al Futures trader.  La constipacin puede tratarse con un laxante o agregando fibra a su dieta. Beber grandes cantidades de lquidos, comer vegetales, frutas y granos integrales es de [place].  Tambin es beneficioso practicar actividad fsica. Si ha sido una persona Engineer, mining, podr continuar con la [employer] de las actividades durante el mismo. Si ha sido [employer], puede ser beneficioso que comience con un programa de ejercicios, Museum/gallery exhibitions officer.  Puede desarrollar hemorroides hacia el final del segundo trimestre. Tomar baos de asiento tibios y Chemical engineer cremas recomendadas por el profesional que lo asiste sern de ayuda para los problemas de hemorroides.  Tambin podr Financial risk analyst de espalda durante este momento de su embarazo. Evite levantar objetos pesados, utilice zapatos de taco bajo y [place] buena postura para ayudar a reducir los problemas de [place].  Algunas mujeres embarazadas desarrollan hormigueo y adormecimiento de la mano y los dedos debido a la hinchazn y compresin de los ligamentos de la mueca (sndrome del tnel carpiano). Esto desaparece una vez que el beb nace.  Como sus pechos se agrandan, Pension scheme manager un sujetador ms grande. Use un sostn de soporte,  cmodo y de algodn. No utilice un sostn para amamantar hasta el ltimo mes de embarazo si va a amamantar al beb.  Podr observar una lnea oscura desde el ombligo hacia la zona pbica denominada linea nigra.  Podr observar que sus mejillas se ponen coloradas debido al aumento de flujo sanguneo en la cara.  Podr desarrollar "araitas" en la cara, cuello y pecho. Esto desaparece una vez que el beb nace. INSTRUCCIONES PARA EL CUIDADO DOMICILIARIO  Es extremadamente importante que evite el cigarrillo, hierbas medicinales, alcohol y las drogas no prescriptas durante el Psychiatrist. Estas sustancias qumicas afectan la formacin y el desarrollo del beb. Evite estas sustancias durante todo el embarazo para asegurar el nacimiento de un beb sano.  La mayor parte de los cuidados que se aconsejan son los mismos que los indicados para Financial risk analyst trimestre del Psychiatrist. Cumpla con las citas tal como se le indic. Siga las instrucciones del profesional que lo asiste con respecto al uso de los medicamentos, el ejercicio y Psychologist, forensic.  Durante el embarazo debe obtener nutrientes para usted y para su beb. Consuma alimentos balanceados a intervalos regulares. Elija alimentos como carne, pescado, [place] y otros productos lcteos descremados, vegetales, frutas, panes integrales y cereales. El Equities trader cul es el aumento de peso ideal.  Las relaciones sexuales fsicas pueden continuarse hasta cerca del fin del embarazo si no existen otros problemas. Estos problemas pueden ser la prdida temprana (  prematura) de lquido amnitico de las [place], sangrado vaginal, dolor abdominal u otros problemas mdicos o del Psychiatrist.  Realice [employer], si no tiene restricciones. Consulte con el profesional que la asiste si no sabe con certeza si determinados ejercicios son seguros. El mayor aumento de peso tiene Environmental consultant durante los ltimos 2 trimestres del Psychiatrist. El ejercicio la ayudar  a:  Engineering geologist.  Ponerla en forma para el parto.  Ayudarla a perder peso luego de haber dado a luz.  Use un buen sostn o como los que se usan para hacer deportes para Paramedic la sensibilidad de las [place]. Tambin puede serle til si lo [place] mientras duerme. Si pierde Product manager, podr [employer].  No utilice la baera con agua caliente, baos turcos y saunas durante el [address]  Utilice el cinturn de seguridad sin excepcin cuando conduzca. Este la proteger a usted y al beb en caso de accidente.  Evite comer carne cruda, queso crudo, y el contacto con los utensilios y desperdicios de los gatos. Estos elementos contienen grmenes que pueden causar defectos de nacimiento en el beb.  El segundo trimestre es un buen momento para visitar a su dentista y Software engineer si an no lo ha hecho. Es Primary school teacher los dientes limpios. Utilice un cepillo de dientes blando. Cepllese ms suavemente durante el embarazo.  Es ms fcil perder algo de orina durante el [place]. Apretar y Chief Operating Officer los msculos de la pelvis la ayudar con este problema. Practique detener la miccin cuando est en el bao. Estos son los mismos msculos que Development worker, international aid. Son [employer] mismos msculos que utiliza cuando trata de [employer] gases. Puede practicar apretando estos msculos 10 veces, y repetir esto 3 veces por da aproximadamente. Una vez que conozca qu msculos debe apretar, no realice estos ejercicios durante la miccin. Puede favorecerle una infeccin si la orina vuelve hacia atrs.  Pida ayuda si tiene necesidades econmicas, de asesoramiento o nutricionales durante el [place]. El profesional podr ayudarla con respecto a estas necesidades, o derivarla a otros especialistas.  La piel puede ponerse grasa. Si esto sucede, lvese la cara con un jabn [place], utilice un humectante no graso y [place] con base de aceite o crema. CONSUMO DE MEDICAMENTOS Y DROGAS  DURANTE EL EMBARAZO  Contine tomando las vitaminas apropiadas para esta etapa tal como se le indic. Las vitaminas deben contener un miligramo de cido flico y deben suplementarse con hierro. Guarde todas las vitaminas fuera del alcance de los nios. La ingestin de slo un par de vitaminas o tabletas que contengan hierro puede ocasionar la [employer] en un beb o en un nio pequeo.  Evite el uso de [place], inclusive los de venta libre y hierbas que no hayan sido prescriptos o indicados por el profesional que la asiste. Algunos medicamentos pueden causar problemas fsicos al beb. Utilice los medicamentos de venta libre o de prescripcin para Chief Technology Officer, Environmental health practitioner o la [place], segn se lo indique el profesional que lo asiste. No utilice aspirina.  El consumo de alcohol est relacionado con ciertos defectos de nacimiento. Esto incluye el sndrome de alcoholismo fetal. Debe evitar el consumo de alcohol en cualquiera de sus formas. El cigarrillo causa nacimientos prematuros y bebs de [place]. El uso de drogas recreativas est absolutamente prohibido. Son muy nocivas para el beb. Un beb que nace de [employer], ser adicto al nacer. Ese beb tendr los mismos sntomas de abstinencia que un adulto.  Infrmele al profesional si consume alguna droga.  No consuma drogas ilegales. Pueden causarle mucho dao al beb. SOLICITE ATENCIN MDICA SI: Tiene preguntas o preocupaciones durante su embarazo. Es mejor que llame para Science writer las dudas que esperar hasta su prxima visita prenatal. Thressa Sheller forma se sentir ms tranquila.  SOLICITE ATENCIN MDICA DE INMEDIATO SI:  La temperatura oral se eleva sin motivo por encima de 102 F (38.9 C) o segn le indique el profesional que lo asiste.  Tiene una prdida de lquido por la vagina (canal de parto). Si sospecha una ruptura de las [place], tmese la temperatura y llame al profesional para informarlo sobre esto.  Observa unas pequeas manchas,  una hemorragia vaginal o elimina cogulos. Notifique al profesional acerca de la cantidad y de cuntos apsitos est utilizando. Unas pequeas manchas de sangre son algo comn durante el Psychiatrist, especialmente despus de Sales promotion account executive.  Presenta un olor desagradable en la secrecin vaginal y observa un cambio en el color, de transparente a blanco.  Contina con las nuseas y no obtiene alivio de los remedios indicados. Vomita sangre o algo similar a la borra del caf.  [place] o sube ms de 900 g. en una semana, o segn lo indicado por el profesional que la asiste.  Observa que se le [employer], las manos, los pies o las piernas.  Ha estado expuesta a la rubola y no ha sufrido la enfermedad.  Ha estado expuesta a la quinta enfermedad o a la varicela.  Presenta dolor abdominal. Las molestias en el ligamento redondo son Neomia Dear causa no cancerosa (benigna) frecuente de dolor abdominal durante el embarazo. El profesional que la asiste deber evaluarla.  Presenta dolor de cabeza intenso que no se [place].  Presenta fiebre, diarrea, dolor al orinar o le falta la respiracin.  Presenta dificultad para ver, visin borrosa, o visin doble.  Sufre una cada, un accidente de trnsito o cualquier tipo de trauma.  Vive en un hogar en el que existe violencia fsica o mental. Document Released: 12/19/2004 Document Revised: 12/04/2011 [facility] Patient Information 2014 [place], [place].

## 2012-08-26 NOTE — Progress Notes (Signed)
P=78 , Used Interpreter , c/o sometimes having a funny feeling she states is hard to describe, but makes her think it is her nerves. Denies depression

## 2012-08-26 NOTE — Progress Notes (Signed)
Doing well.  Mood and affect nl. Denies anxiety/depression. Anatomy US.

## 2012-08-28 ENCOUNTER — Encounter: Payer: Self-pay | Admitting: *Deleted

## 2012-08-28 DIAGNOSIS — Z3490 Encounter for supervision of normal pregnancy, unspecified, unspecified trimester: Secondary | ICD-10-CM

## 2012-09-03 ENCOUNTER — Ambulatory Visit (HOSPITAL_COMMUNITY): Payer: Self-pay

## 2012-09-03 ENCOUNTER — Ambulatory Visit (HOSPITAL_COMMUNITY)
Admission: RE | Admit: 2012-09-03 | Discharge: 2012-09-03 | Disposition: A | Discharge status: Home / Self Care | Payer: Self-pay | Source: Ambulatory Visit | Attending: Obstetrics and Gynecology | Admitting: Obstetrics and Gynecology

## 2012-09-03 DIAGNOSIS — O09529 Supervision of elderly multigravida, unspecified trimester: Secondary | ICD-10-CM | POA: Insufficient documentation

## 2012-09-03 DIAGNOSIS — O289 Unspecified abnormal findings on antenatal screening of mother: Secondary | ICD-10-CM | POA: Insufficient documentation

## 2012-09-03 DIAGNOSIS — O358XX0 Maternal care for other (suspected) fetal abnormality and damage, not applicable or unspecified: Secondary | ICD-10-CM | POA: Insufficient documentation

## 2012-09-03 NOTE — Progress Notes (Signed)
Genetic Counseling  High-Risk Gestation Note  Appointment Date:  09/03/2012 Referred By: Danae Orleans, CNM Date of Birth:  1976/01/24    Pregnancy History: [id number] Estimated Date of Delivery: 01/28/13 Estimated Gestational Age: [redacted]w[redacted]d Attending: Particia Nearing, MD    Ms. Kirsten Sanchez was seen for genetic counseling regarding a maternal age of 37 y.o. and an increased risk for Down syndrome based on Quad screen performed through [facility].  She was counseled regarding maternal age and the association with risk for chromosome conditions due to nondisjunction with aging of the ova.   We reviewed chromosomes, nondisjunction, and the associated 1 in 57 risk for fetal aneuploidy related to a maternal age of 37 y.o. at [redacted]w[redacted]d gestation.  They were counseled that the risk for aneuploidy decreases as gestational age increases, accounting for those pregnancies which spontaneously abort.  We specifically discussed Down syndrome (trisomy 18), trisomies 21 and 23, and sex chromosome aneuploidies (47,XXX and 47,XXY) including the common features and prognoses of each.   We also reviewed Kirsten Sanchez's maternal serum Quad screen result and the associated decrease in risk for fetal Down syndrome (1 in 193 to 1 in 214). However, since 1 in 214 is above the screen's cutoff, this is still considered a screen positive result.  She was counseled regarding other explanations for a screen positive result including normal variation and differences in maternal metabolism.  In addition, we reviewed the screen adjusted reduction in risks for trisomy 18 (1 in 582 to 1 in 7,006) and ONTDs.  She understands that Quad screening provides a pregnancy specific risk for Down syndrome, but is not considered to be diagnostic.    We reviewed other available screening options including noninvasive prenatal screening (NIPS)/cell free fetal DNA and detailed ultrasound.  Specifically, we discussed that  NIPS analyzes cell free fetal DNA found in the maternal circulation. This test is not diagnostic for chromosome conditions, but can provide information regarding the presence or absence of extra fetal DNA for chromosomes 13, 18, 21, X, and Y, and missing fetal DNA for chromosome X and Y (Turner syndrome). Thus, it would not identify or rule out all genetic conditions. The reported detection rate is greater than 99% for Trisomy 21, greater than 98% for Trisomy 18, and is approximately 80% (8 out of 10) for Trisomy 13. The false positive rate is reported to be less than 0.1% for any of these conditions.  In addition, we discussed that ~50-80% of fetuses with Down syndrome and up to 90-95% of fetuses with trisomy 18/13, when well visualized, have detectable anomalies or soft markers by detailed ultrasound (~18+ weeks gestation).   Ms. Kirsten Sanchez was also counseled regarding diagnostic testing via amniocentesis.  We reviewed the approximate 1 in 300-500 risk for complications, including spontaneous pregnancy loss. After consideration of all the options, she elected to proceed with detailed ultrasound only and declined NIPS and amniocentesis.  A complete ultrasound was performed today.  The ultrasound report is under separate cover.  Both family histories were reviewed and found to be contributory for Down syndrome for the patient's paternal aunt. The patient reported that her aunt had facial features similar to Down syndrome and was the youngest of 8 children. She reported that her aunt died at age 11 years. The patient had limited information regarding her aunt's condition, given that she resided in [place]. We discussed that 95% of cases of Down syndrome are not inherited and are the result of non-disjunction.  Three to 4% of cases of Down syndrome are the result of a translocation involving chromosome #21.  We discussed the option of chromosome analysis to determine if an individual is a carrier of a  balanced translocation involving chromosome #21.  If an individual carries a balanced translocation involving chromosome #21, then the chance to have a baby with Down syndrome would be greater than the maternal age-related risk. We discussed that given the reported family history, this relative's Down syndrome was most likely sporadic. Without further information regarding the provided family history, an accurate genetic risk cannot be calculated. Further genetic counseling is warranted if more information is obtained.  Ms. Kirsten Sanchez denied exposure to environmental toxins or chemical agents. She denied the use of alcohol, tobacco or street drugs. She denied significant viral illnesses during the course of her pregnancy. Her medical and surgical histories were noncontributory.     I counseled Kirsten Sanchez regarding the above risks and available options.  The approximate face-to-face time with the genetic counselor was 40 minutes.    Quinn Plowman, MS,  Certified Genetic Counselor 09/03/2012

## 2012-09-23 ENCOUNTER — Ambulatory Visit (INDEPENDENT_AMBULATORY_CARE_PROVIDER_SITE_OTHER): Payer: Medicaid Other | Admitting: Family

## 2012-09-23 VITALS — BP 95/52 | Wt 163.7 lb

## 2012-09-23 DIAGNOSIS — Z3480 Encounter for supervision of other normal pregnancy, unspecified trimester: Secondary | ICD-10-CM

## 2012-09-23 DIAGNOSIS — O09529 Supervision of elderly multigravida, unspecified trimester: Secondary | ICD-10-CM

## 2012-09-23 DIAGNOSIS — Z124 Encounter for screening for malignant neoplasm of cervix: Secondary | ICD-10-CM

## 2012-09-23 DIAGNOSIS — Z3490 Encounter for supervision of normal pregnancy, unspecified, unspecified trimester: Secondary | ICD-10-CM

## 2012-09-23 DIAGNOSIS — Z3492 Encounter for supervision of normal pregnancy, unspecified, second trimester: Secondary | ICD-10-CM

## 2012-09-23 LAB — POCT URINALYSIS DIP (DEVICE)
Bilirubin Urine: NEGATIVE
Glucose, UA: NEGATIVE mg/dL
Hgb urine dipstick: NEGATIVE
Nitrite: NEGATIVE

## 2012-09-23 NOTE — Progress Notes (Signed)
No questions or concerns; Pap smear collected.

## 2012-09-23 NOTE — Progress Notes (Signed)
Pulse: 67

## 2012-09-30 ENCOUNTER — Encounter: Payer: Self-pay | Admitting: Family

## 2012-10-21 ENCOUNTER — Encounter: Payer: Self-pay | Admitting: Obstetrics and Gynecology

## 2012-10-21 ENCOUNTER — Ambulatory Visit (INDEPENDENT_AMBULATORY_CARE_PROVIDER_SITE_OTHER): Payer: Medicaid Other | Admitting: Obstetrics and Gynecology

## 2012-10-21 VITALS — BP 102/56 | Temp 98.2°F | Wt 166.2 lb

## 2012-10-21 DIAGNOSIS — O09529 Supervision of elderly multigravida, unspecified trimester: Secondary | ICD-10-CM

## 2012-10-21 DIAGNOSIS — Z3492 Encounter for supervision of normal pregnancy, unspecified, second trimester: Secondary | ICD-10-CM

## 2012-10-21 DIAGNOSIS — O09522 Supervision of elderly multigravida, second trimester: Secondary | ICD-10-CM

## 2012-10-21 DIAGNOSIS — Z3480 Encounter for supervision of other normal pregnancy, unspecified trimester: Secondary | ICD-10-CM

## 2012-10-21 LAB — POCT URINALYSIS DIP (DEVICE)
Bilirubin Urine: NEGATIVE
Glucose, UA: NEGATIVE mg/dL
Ketones, ur: NEGATIVE mg/dL
pH: 7.5 (ref 5.0–8.0)

## 2012-10-21 NOTE — Progress Notes (Signed)
Doing well. Contraception info given. RLP discussed.

## 2012-10-21 NOTE — Patient Instructions (Signed)
Eleccin del mtodo anticonceptivo  (Contraception Choices) La anticoncepcin (control de la natalidad) es el uso de cualquier mtodo o dispositivo para Location manager. A continuacin se indican algunos de esos mtodos.  MTODOS HORMONALES   Implante anticonceptivo. Es un tubo plstico delgado que contiene la hormona progesterona. No contiene estrgenos. El mdico inserta el tubo en la parte interna del brazo. El tubo puede Geneticist, molecular durante 3 aos. Despus de los 3 aos debe retirarse. El implante impide que los ovarios liberen vulos (ovulacin), espesa el moco cervical, lo que evita que los espermatozoides ingresen al tero y hace ms delgada la membrana que cubre el interior del tero.  Inyecciones de progesterona sola. Estas inyecciones se administran cada 3 meses para evitar el embarazo. La progesterona sinttica impide que los ovarios liberen vulos. Tambin hace que el moco cervical se espese y modifica el recubrimiento interno del tero. Esto hace ms difcil que los espermatozoides sobrevivan en el tero.  Pldoras anticonceptivas. Las pldoras anticonceptivas contienen estrgenos y Education officer, museum. Actan impidiendo que el vulo se forme en el ovario(ovulacin). Las pldoras anticonceptivas son recetadas por el mdico.Tambin se utilizan para tratar los perodos menstruales abundantes.  Minipldora. Este tipo de pldora anticonceptiva contiene slo hormona progesterona. Deben tomarse todos los [address]  [address] del mes y debe recetarlas el mdico.  Parches anticonceptivos. El parche contiene hormonas similares a las que contienen las pldoras anticonceptivas. Deben cambiarse una vez por semana y se utilizan bajo prescripcin mdica.  Anillo vaginal. Anillo vaginal contiene hormonas similares a las que contienen las pldoras anticonceptivas. Se deja colocado durante tres semanas, se lo retira durante 1 semana y luego se coloca uno nuevo. La paciente debe sentirse cmoda para insertar y  retirar el anillo de la vagina.Es necesaria la receta del mdico.  Anticonceptivos de Associate Professor. Los anticonceptivos de emergencia son mtodos para evitar un embarazo despus de una relacin sexual sin proteccin. Esta pldora puede tomarse inmediatamente despus de Child psychotherapist sexuales o hasta 5 [place] de haber tenido sexo sin proteccin. Es ms efectiva si se toma poco tiempo despus. Los anticonceptivos de emergencia estn disponibles sin prescripcin mdica. Consltelo con su farmacutico. No use los anticonceptivos de emergencia como nico mtodo anticonceptivo. MTODOS DE BARRERA   Condn masculino. Es una vaina delgada (ltex o goma) que se [place] en el pene durante el acto sexual. Deri Fuelling con espermicida para aumentar la efectividad.  Condn femenino. Es una vaina blanda y floja que se adapta suavemente a la vagina antes de las relaciones sexuales.  Diafragma. Es una barrera de ltex redonda y Casimer Bilis que debe ser ajustada por un profesional. Se inserta en la vagina, junto con un gel espermicida. Debe insertarse antes de Management consultant. Debe dejar el diafragma colocado en la vagina durante 6 a 8 horas despus de la relacin sexual.  Capuchn cervical. Es una taza de ltex o plstico, redonda y [place] que cubre el cuello del tero y debe ser ajustada por un mdico. Puede dejarlo colocado en la vagina hasta 48 horas despus de las Clinical research associate.  Esponja. Es una pieza blanda y circular de espuma de poliuretano. Contiene un espermicida. Se inserta en la vagina despus de mojarla y antes de las [employer].  Espermicidas. Los espermicidas son qumicos que matan o bloquean el esperma y no lo dejan ingresar al cuello del tero y al tero. Vienen en forma de cremas, geles, supositorios, espuma o comprimidos. No es necesario tener Emergency planning/management officer. Se insertan en la vagina con un aplicador  antes de [employer]. El proceso debe repetirse cada vez que tiene  relaciones sexuales. ANTICONCEPTIVOS INTRAUTERINOS   Dispositivo intrauterino (DIU). Es un dispositivo en forma de T que se coloca en el tero durante el perodo menstrual, para Location manager. Hay dos tipos:  DIU de cobre. Este tipo de DIU est recubierto con un alambre de cobre y se inserta dentro del tero. El cobre hace que el tero y las trompas de Falopio produzcan un liquido que [employer] espermatozoides. Puede permanecer colocado durante 10 aos.  DIU hormonal. Este tipo de DIU contiene la hormona progestina (progesterona sinttica). La hormona espesa el moco cervical y evita que los espermatozoides ingresen al tero y tambin afina la membrana que cubre el tero para evitar la implantacin del vulo fertilizado. La hormona debilita o destruye los espermatozoides que ingresan al tero. Puede permanecer colocado durante 5 aos. MTODOS ANTICONCEPTIVOS PERMANENTES   Ligadura de trompas en la mujer. La ligadura de trompas en la mujer se realiza sellando, atando u obstruyendo quirrgicamente las trompas de Falopio lo que impide que el vulo descienda hacia el tero.  Esterilizacin masculina. Se realiza atando los conductos por los que pasan los espermatozoides (vasectoma).Esto impide que el esperma ingrese a la vagina durante el acto sexual. Luego del procedimiento, el hombre puede eyacular lquido (semen). MTODOS DE PLANIFICACIN NATURAL   Planificacin familiar natural.  Consiste en no tener relaciones sexuales o usar un mtodo de barrera (condn, [place], capuchn cervical) en los [employer] la mujer podra quedar [place].  Mtodo calendario.  Consiste en el seguimiento de la duracin de cada ciclo menstrual y la identificacin de los perodos frtiles.  Mtodo de Occupational hygienist.  Consiste en evitar las relaciones sexuales durante la ovulacin.  Mtodo sintotrmico. Paramedic las relaciones sexuales en la poca en la que se est ovulando, utilizando un termmetro y  tendiendo en cuenta los sntomas de la ovulacin.  Mtodo post-ovulacin. Consiste en planificar las relaciones sexuales para despus de haber ovulado. Independientemente del tipo o mtodo anticonceptivo que usted elija, es importante que use condones para protegerse contra las enfermedades de transmisin sexual (ETS). Hable con su mdico con respecto a qu mtodo anticonceptivo es el ms apropiado para usted.  Document Released: 03/11/2005 Document Revised: 06/03/2011 [facility] Patient Information 2014 [place], [place]. Dolor del ligamento redondo (Round Ligament Pain) El ligamento redondo se compone de msculo y tejido fibroso. Est unido al tero cerca de las trompas de Falopio El ligamento redondo est ubicado en ambos lados del tero y [place] a Pharmacologist su posicin. Normalmente comienza en el segundo trimestre del embarazo cuando el tero sale hacia afuera de la pelvis. El dolor puede aparecer y desaparecer hasta el nacimiento del beb. El dolor de ligamento redondo no es un problema serio y no ocasiona daos al beb. CAUSAS Durante el Consulting civil engineer tero crece mayormente desde el segundo trimestre [place]. A medida que crece, se estira y tuerce ligeramente los ligamentos. Cuando el tero ejerce presin en ambos lados, el ligamento redondo del lado opuesto presiona y se Psychologist, occupational. Esto causa dolor. SNTOMAS El dolor puede ocurrir en uno o ambos lados. El dolor es por lo general como un pellizco corto y filoso. A veces puede ser un dolor opaco y persistente. Se siente en la parte baja del abdomen o en la ingle. Es un Adult nurse, y por lo general comienza en la ingle y se mueve hacia la zona de la cadera. El dolor puede ocurrir con:  Cambio de posicin repentino como el levantarse de la cama o de una silla.  Darse vuelta en la cama.  Toser o estornudar.  Caminar demasiado.  Cualquier tipo de actividad fsica. DIAGNSTICO El medico deber asegurarse de que no existen problemas graves que  Audiological scientist. Si no encuentra nada grave, los sntomas suelen indicar de que se trata de un dolor proveniente del ligamento redondo. TRATAMIENTO  Sintese y reljese cuando el dolor comience.  Llevar las rodillas [employer].  Recustese sobre un lado con una almohada debajo del vientre (abdomen) y Oletta Lamas sus piernas.  Sintese en un bao caliente de 15 a 20 minutos o hasta que el dolor desaparezca. INSTRUCCIONES PARA EL CUIDADO DOMICILIARIO  Utilice los medicamentos de venta libre o de prescripcin para Chief Technology Officer, el malestar o la [place], segn se lo indique el profesional que lo asiste.  Sintese y pngase de pie lentamente.  Evite caminatas largas si le ocasionan dolor.  Detenga o disminuya las actividades fsicas si Sports administrator. SOLICITE ATENCIN MDICA SI:  El dolor no desaparece con estas medidas.  Necesita que le prescriban medicamentos ms fuertes para Chief Technology Officer.  Desarrolla un dolor de espalda que no haba sentido antes junto con el de lado. SOLICITE ATENCIN MDICA DE INMEDIATO SI:  La temperatura se eleva por encima de 38,9 C (102 F) o ms.  Siente contracciones uterinas.  Presenta hemorragia vaginal.  Presenta nuseas, diarrea o vmitos.  Comienza a sentir escalofros  Electronics engineer al [employer]. Document Released: 02/22/2008 Document Revised: 06/03/2011 [facility] Patient Information 2014 [place], [place].

## 2012-10-21 NOTE — Progress Notes (Signed)
Pulse- 63   Pain/pressure- lower abd, at night feel pain

## 2012-11-04 ENCOUNTER — Ambulatory Visit (INDEPENDENT_AMBULATORY_CARE_PROVIDER_SITE_OTHER): Payer: Medicaid Other | Admitting: Advanced Practice Midwife

## 2012-11-04 VITALS — BP 100/64 | Wt 169.7 lb

## 2012-11-04 DIAGNOSIS — K5901 Slow transit constipation: Secondary | ICD-10-CM

## 2012-11-04 DIAGNOSIS — Z3490 Encounter for supervision of normal pregnancy, unspecified, unspecified trimester: Secondary | ICD-10-CM

## 2012-11-04 DIAGNOSIS — Z3480 Encounter for supervision of other normal pregnancy, unspecified trimester: Secondary | ICD-10-CM

## 2012-11-04 DIAGNOSIS — IMO0002 Reserved for concepts with insufficient information to code with codable children: Secondary | ICD-10-CM | POA: Insufficient documentation

## 2012-11-04 DIAGNOSIS — O09529 Supervision of elderly multigravida, unspecified trimester: Secondary | ICD-10-CM

## 2012-11-04 LAB — RPR

## 2012-11-04 LAB — POCT URINALYSIS DIP (DEVICE)
Glucose, UA: NEGATIVE mg/dL
Ketones, ur: NEGATIVE mg/dL
Protein, ur: NEGATIVE mg/dL
Specific Gravity, Urine: 1.02 (ref 1.005–1.030)

## 2012-11-04 LAB — GLUCOSE TOLERANCE, 1 HOUR (50G) W/O FASTING: Glucose, 1 Hour GTT: 177 mg/dL — ABNORMAL HIGH (ref 70–140)

## 2012-11-04 LAB — HIV ANTIBODY (ROUTINE TESTING W REFLEX): HIV: NONREACTIVE

## 2012-11-04 LAB — CBC
HCT: 35.5 % — ABNORMAL LOW (ref 36.0–46.0)
Hemoglobin: 11.7 g/dL — ABNORMAL LOW (ref 12.0–15.0)
RBC: 3.79 MIL/uL — ABNORMAL LOW (ref 3.87–5.11)
WBC: 7.9 10*3/uL (ref 4.0–10.5)

## 2012-11-04 NOTE — Patient Instructions (Addendum)
Constipación - Adulto   (Constipation, Adult)   Constipación significa que una persona tiene menos de 3 evacuaciones en una semana, hay dificultad para evacuar el intestino, o las heces son secas, duras, o más grandes que lo normal. A medida que envejecemos el estreñimiento es más común. Si intenta curar el estreñimiento con medicamentos que producen la evacuación intestinal (laxantes), el problema puede empeorar. El uso prolongado de laxantes puede hacer que los músculos del colon se debiliten. Una dieta baja en fibra, no tomar suficientes líquidos y el uso de ciertos medicamentos pueden empeorar el estreñimiento.   CAUSAS   · Ciertos medicamentos, como los antidepresivos, analgésicos, suplementos de hierro, antiácidos y diuréticos.    · Algunas enfermedades, como la diabetes, el síndrome del colon irritable (SII), enfermedad de la tiroides, o depresión.    · No beber suficiente agua.    · No consumir suficientes alimentos ricos en fibra.    · Situaciones de estrés o viajes.  · Falta de actividad física o de ejercicio.  · No ir al baño cuando siente la necesidad.  · Ignorar la necesidad súbita de mover el intestino.  · Uso en exceso de laxantes.  SÍNTOMAS   · Evacuar el intestino menos de 3 veces a la semana.    · Dificultad para mover el intestino    · Tener las heces secas y duras, o más grandes que las normales.    · Sensación de estar lleno o distendido.    · Dolor en la parte baja del abdomen  · No se siente alivio después de evacuar el intestino.  DIAGNÓSTICO   El médico le hará una historia clínica y le hará un examen físico. Pueden hacerle exámenes adicionales para el estreñimiento grave. Algunas pruebas son:   · Un radiografía con enema de bario para examinar el recto, el colon y en algunos casos el intestino delgado.  · Una sigmoidoscopia para examinar el colon inferior.  · Una colonoscopia para examinar todo el colon.  TRATAMIENTO   El tratamiento dependerá de la gravedad de la constipación y de la  causa. Algunos tratamientos dietéticos son beber más líquidos y comer más alimentos ricos en fibra. El cambio en el estilo de vida incluye hacer ejercicios de manera regular. Si estas recomendaciones para realizar cambios en la dieta y en el estilo de vida no ayudan, el médico le puede indicar el uso de laxantes de venta libre para favorecer el movimiento intestinal. Los medicamentos con receta se pueden prescribir si los medicamentos de venta libre no lo mejoran.   INSTRUCCIONES PARA EL CUIDADO EN EL HOGAR   · Aumente el consumo de alimentos con fibra, como frutas, verduras, granos enteros y frijoles. Limite los azúcares ricos en grasas y procesados   en su dieta, tales como papas fritas, hamburguesas, galletas, dulces y refrescos.    · Puede agregar un suplemento de fibra a su dieta si no obtiene lo suficiente de los alimentos.    · Debe ingerir gran cantidad de líquido para mantener la orina de tono claro o color amarillo pálido.    · Haga ejercicios regularmente o según las indicaciones de su médico.    · Vaya al baño cuando sienta la necesidad de ir. No espere.  · Tome sólo la medicación que le indicó el profesional.  No tome otros medicamentos para la constipación sin consultar a su médico.  SOLICITE ATENCIÓN MÉDICA DE INMEDIATO SI:   · Observa sangre brillante en las heces.    · La constipación dura más de 4 días o empeora.    · Siente   dolor abdominal o rectal.    · Las heces son delgadas como un lápiz.  · Pierde peso de manera inexplicable.  ASEGÚRESE DE QUE:   · Comprende estas instrucciones.  · Controlará su enfermedad.  · Solicitará ayuda de inmediato si no mejora o empeora.  Document Released: 03/31/2007 Document Revised: 09/10/2011  ExitCare® Patient Information ©2014 [employer] LLC.

## 2012-11-04 NOTE — Progress Notes (Signed)
Patient reports no blood or discharge from the vagina.  She is eating drinking, voiding and defecating.  Patient complains of constipation.  Colace was discussed.  She reports good baby movement and no change in frequency.

## 2012-11-04 NOTE — Progress Notes (Signed)
Pulse: 71

## 2012-11-06 NOTE — Progress Notes (Signed)
I have seen this patient and agree with the previous student's note.  LEFTWICH-KIRBY, Ko Bardon Certified Nurse-Midwife

## 2012-11-12 ENCOUNTER — Other Ambulatory Visit: Payer: Self-pay

## 2012-11-12 DIAGNOSIS — R7309 Other abnormal glucose: Secondary | ICD-10-CM

## 2012-11-13 ENCOUNTER — Other Ambulatory Visit: Payer: Self-pay

## 2012-11-13 LAB — GLUCOSE TOLERANCE, 3 HOURS
Glucose Tolerance, 1 hour: 189 mg/dL (ref 70–189)
Glucose Tolerance, 2 hour: 174 mg/dL — ABNORMAL HIGH (ref 70–164)
Glucose Tolerance, Fasting: 105 mg/dL — ABNORMAL HIGH (ref 70–104)
Glucose, GTT - 3 Hour: 103 mg/dL (ref 70–144)

## 2012-11-18 ENCOUNTER — Ambulatory Visit (INDEPENDENT_AMBULATORY_CARE_PROVIDER_SITE_OTHER): Payer: Medicaid Other | Admitting: Advanced Practice Midwife

## 2012-11-18 ENCOUNTER — Encounter: Payer: Self-pay | Admitting: Advanced Practice Midwife

## 2012-11-18 VITALS — BP 108/53 | Temp 97.9°F | Wt 170.0 lb

## 2012-11-18 DIAGNOSIS — O0990 Supervision of high risk pregnancy, unspecified, unspecified trimester: Secondary | ICD-10-CM

## 2012-11-18 DIAGNOSIS — O24419 Gestational diabetes mellitus in pregnancy, unspecified control: Secondary | ICD-10-CM | POA: Insufficient documentation

## 2012-11-18 DIAGNOSIS — O99810 Abnormal glucose complicating pregnancy: Secondary | ICD-10-CM

## 2012-11-18 LAB — POCT URINALYSIS DIP (DEVICE)
Protein, ur: NEGATIVE mg/dL
Urobilinogen, UA: 0.2 mg/dL (ref 0.0–1.0)
pH: 7 (ref 5.0–8.0)

## 2012-11-18 NOTE — Progress Notes (Signed)
Patient came in today and was advised of abnormal 3 hr GTT. She is going to be scheduled for MOnday as noted on AVS.

## 2012-11-18 NOTE — Progress Notes (Signed)
Feels well. Denies pain or bleeding.  Reviewed 3 hr GTT. 2 elevated values (fasting and 2 hr).  Informed patient and discussed diagnosis. Advised we will bring her back Monday for teaching. Will start testing and diet.

## 2012-11-18 NOTE — Progress Notes (Signed)
Pulse: 66

## 2012-11-18 NOTE — Patient Instructions (Addendum)
Diabetes mellitus gestacional   (Gestational Diabetes Mellitus)  La diabetes mellitus gestacional, más comúnmente conocida como diabetes gestacional es un tipo de diabetes que desarrollan algunas mujeres durante el embarazo. En la diabetes gestacional, el páncreas no produce suficiente insulina (una hormona), las células son menos sensibles a la insulina que se produce (resistencia a la insulina), o ambos. Normalmente, la insulina mueve los azúcares de los alimentos a las células de los tejidos. Las células de los tejidos utilizan los azúcares para obtener energía. La falta de insulina o la falta de una respuesta normal a la insulina hace que el exceso de azúcar se acumule en la sangre en lugar de penetrar en las células de los tejidos. Como resultado, se desarrollan los niveles altos de azúcar en la sangre (hiperglucemia). El efecto de los niveles altos de azúcar (glucosa) puede causar muchas complicaciones.   FACTORES DE RIESGO  Usted tiene mayor probabilidad de desarrollar diabetes gestacional si tiene antecedentes familiares de diabetes y también si tiene uno o más de los siguientes factores de riesgo:   · Índice de masa corporal superior a 30 (obesidad).  · Embarazo previo con diabetes gestacional.  · Mayor edad en el momento del embarazo.  Si se mantienen los niveles de glucosa en sangre en un rango normal durante el embarazo, las mujeres pueden tener un embarazo saludable. Si no controla bien sus niveles de glucosa en sangre, pueden tener tener riesgos usted, su bebé antes de nacer (feto), el trabajo de parto y el parto, o el bebé recién nacido.   SÍNTOMAS   Si se presentan síntomas, éstos son similares a los síntomas que normalmente experimentará durante el embarazo. Los síntomas de la diabetes gestacional son:   · Aumento de la sed (polidipsia).  · Aumento de ganas de orinar (poliuria).  · Aumento de ganas de orinar durante la noche (nicturia).  · Pérdida de peso. Pérdida de peso que puede ser muy  rápida.  · Infecciones frecuentes y recurrentes.  · Cansancio (fatiga).  · Debilidad.  · Cambios en la visión, como visión borrosa.  · Olor a fruta en el aliento.  · Dolor abdominal.  DIAGNÓSTICO  La diabetes se diagnostica cuando hay aumento de los niveles de glucosa en la sangre. El nivel de glucosa en la sangre puede controlarse en uno o más de los siguientes análisis de sangre:   · Medición de glucosa en sangre en ayunas. No deberá comer durante al menos 8 horas antes de que se tome la muestra de sangre.  · Análisis al azar de glucosa en sangre. El nivel de glucosa en sangre se controla en cualquier momento del día sin importar el momento en que haya comido.  · Pruebas de glucosa de sangre de hemoglobina A1c. Un análisis de la hemoglobina A1c proporciona información sobre el control de la glucosa en la sangre durante los últimos 3 meses.  · Prueba oral de tolerancia a la glucosa (SOG). La glucosa en la sangre se mide después de no haber comido (ayuno) durante 1-3 horas y después de beber una bebida que contiene glucosa. Dado que las hormonas que causan la resistencia a la insulina son más altas alrededor de las semanas 24-28 de embarazo, generalmente se realiza un SOG durante ese tiempo. Si tiene factores de riesgo para la diabetes gestacional, su médico puede detectarla antes de las 24 semanas de embarazo.  TRATAMIENTO   · Usted tendrá que tomar medicamentos para la diabetes o insulina diariamente para mantener los niveles de glucosa en sangre   en el rango deseado.  · Usted tendrá que hacer coincidir la dosis de insulina con el ejercicio y la elección de alimentos saludables.  El objetivo del tratamiento es mantener el nivel de glucosa en sangre en 60-99 mg / dl antes de la comida (preprandial), a la hora de acostarse y durante la noche mientras dure su embarazo. El objetivo del tratamiento es mantener el mayor pico de azúcar en sangre después de la comida (glucosa postprandial) en 100-140 mg/dl.   INSTRUCCIONES  PARA EL CUIDADO EN EL HOGAR   · Haga que su nivel de hemoglobina A1c sea verificado dos veces al año.  · Realice un control diario de glucosa en sangre según las indicaciones de su médico. Es común realizar controles con frecuencia de la glucosa en sangre.  · Supervise las cetonas en la orina cuando esté enferma y según las indicaciones de su médico.  · Tome su medicamento para la diabetes y la insulina según las indicaciones de su médico para mantener el nivel de glucosa en sangre en su rango deseado.  · Nunca se quede sin medicamentos para la diabetes o sin insulina. Es necesario recibirla todos los días.  · Ajuste la insulina sobre la base de la ingesta de hidratos de carbono. Los hidratos de carbono pueden aumentar los niveles de glucosa en la sangre, pero deben incluirse en su dieta. Los hidratos de carbono aportan vitaminas, minerales y fibra que son una parte esencial de una dieta saludable. Los hidratos de carbono se encuentran en frutas, verduras, granos enteros, productos lácteos, legumbres y alimentos que contienen azúcares añadidos.  ·   · Consuma alimentos saludables. Alterne 3 comidas con 3 colaciones.  · Mantenga un aumento de peso saludable. El aumento del peso total varía de acuerdo con el índice de masa corporal antes del embarazo ([facility]).  · Lleve una tarjeta de alerta médica o lleve una pulsera de alerta médica.  · Lleve consigo un refrigerio de 15 gramos de hidrato de carbonos en todo momento para controlar los niveles bajos de glucosa en sangre (hipoglucemia). Algunos ejemplos de aperitivos de 15 gramos de hidratos de carbono son:  · Tabletas de glucosa, 3 o 4  ·   Gel de glucosa, tubo de 15 gramos  · Pasas de uva, 2 cucharadas (24 g)  · Caramelos de goma, 6  · Galletas de animales, 8  · Jugo de fruta, soda regular, o leche baja en grasa, 4 onzas (120 ml)  · Pastillas gomitas, 9  ·   · Reconocer la hipoglucemia. Durante el embarazo la hipoglucemia se produce cuando hay niveles de glucosa en  sangre de 60 mg/dl o menos. El riesgo de hipoglucemia aumenta durante el ayuno o saltarse las comidas, durante o después del ejercicio intenso, y durante el sueño. Los síntomas de hipoglucemia son:  · Temblores o sacudidas.  · Disminución de capacidad de concentración.  · Sudoración.  · Aumento en el ritmo cardíaco  · Dolor de cabeza.  · Boca seca.  · Hambre.  · Irritabilidad.  · Ansiedad.  · Sueño agitado.  · Alteración del habla o de la coordinación.  · Confusión.  · Tratar la hipoglucemia rápidamente. Si usted está alerta y puede tragar con seguridad, siga la regla de 15:15:  · Tome de 15 a 20 gramos de glucosa de acción rápida o hidratos de carbono . Las opciones de acción rápida son un gel de glucosa, unas tabletas de glucosa, o 4 onzas (120 ml) de jugo de frutas, soda   regular, o leche baja en grasa.  · Compruebe su nivel de glucosa en sangre 15 minutos después de tomar la glucosa.  ·   Tome de 15 a 20 gramos más de glucosa si al repetir el nivel de glucosa en la sangre todavía es de 70 mg / dl o inferior.  · Coma una comida o una colación dentro de 1 hora una vez que los niveles de glucosa en la sangre vuelven a la normalidad.  · Esté alerta a la poliuria y polidipsia, que son los primeros signos de hiperglucemia. El conocimiento temprano de la hiperglucemia permite un tratamiento oportuno. Controle la hiperglucemia según le indicó su médico.  · Haga actividad física por lo menos 30 minutos al día o como lo indique su médico. Se recomienda diez minutos de actividad física cronometrados 30 minutos después de cada comida para controlar los niveles de glucosa en sangre postprandial.  · Ajuste su dosis de insulina y la ingesta de alimentos, según sea necesario, si se inicia un nuevo ejercicio o deporte.  · Siga su plan diario de enfermo en algún momento que no puede comer o beber como de costumbre.  · Evite el tabaco y el alcohol.  · Concurra regularmente a las visitas de control con el médico.  · Siga el consejo  de su médico respecto a los controles prenatales y posteriores al parto (post-parto), a la planificación de las comidas, al ejercicio, a los medicamentos, a las vitaminas, a los análisis de sangre, a otras pruebas médicas y físicas.  · Cuide diariamente la piel y los pies. Examine su piel y los pies diariamente para detectar cortes, moretones, enrojecimiento, problemas en las uñas, sangrado, ampollas o llagas.  · Cepíllese los dientes y encías por lo menos dos veces al día y use hilo dental al menos una vez por día. Concurra regularmente a las visitas de control con el dentista.  · Programe un examen de vista durante el primer trimestre de su embarazo o como lo indique su médico.  · Comparta su plan de control de diabetes en su trabajo o en la escuela.  · Manténgase al día con las vacunas.  · Aprenda a manejar el estrés.  · Obtenga educación continuada y ayuda para la diabetes cuando sea necesario.  SOLICITE ATENCIÓN MÉDICA SI:   · No puede comer alimentos o beber por más de 6 horas.  · Tiene náuseas o ha vomitado durante más de 6 horas.  · Tiene un nivel de glucosa en sangre de 200 mg/dl y cetonas en la orina.  · Presenta algún cambio en el estado mental.  · Tiene problemas de visión.  · Sufre un dolor persistente de cabeza.  · Tiene dolor o molestias en el abdomen.  · Desarrolla una enfermedad grave adicional.  · Tiene diarrea durante más de 6 horas.  · Ha estado enferma o ha tenido fiebre durante un par de días y no mejora.  SOLICITE ATENCIÓN MÉDICA DE INMEDIATO SI:   · Tiene dificultad para respirar.  · Ya no siente los movimientos del bebé.  · Está sangrando o tiene flujo vaginal.  · Comienza a tener contracciones o trabajo de parto prematuro.  ASEGÚRESE DE QUE:  · Comprende estas instrucciones.  · Controlará su enfermedad.  · Solicitará ayuda de inmediato si no mejora o si empeora.  Document Released: 12/19/2004 Document Revised: 12/04/2011  Exit[employer]® Patient Information ©2014 [employer].

## 2012-11-26 ENCOUNTER — Ambulatory Visit: Payer: Self-pay

## 2012-11-26 ENCOUNTER — Ambulatory Visit (INDEPENDENT_AMBULATORY_CARE_PROVIDER_SITE_OTHER): Payer: Self-pay | Admitting: Family

## 2012-11-26 VITALS — Temp 97.6°F | Wt 171.0 lb

## 2012-11-26 DIAGNOSIS — Z3490 Encounter for supervision of normal pregnancy, unspecified, unspecified trimester: Secondary | ICD-10-CM

## 2012-11-26 DIAGNOSIS — Z3480 Encounter for supervision of other normal pregnancy, unspecified trimester: Secondary | ICD-10-CM

## 2012-11-26 LAB — POCT URINALYSIS DIP (DEVICE)
Glucose, UA: NEGATIVE mg/dL
Nitrite: NEGATIVE
Protein, ur: NEGATIVE mg/dL
Specific Gravity, Urine: 1.02 (ref 1.005–1.030)
Urobilinogen, UA: 1 mg/dL (ref 0.0–1.0)

## 2012-11-26 NOTE — Progress Notes (Signed)
Pulse

## 2012-11-26 NOTE — Progress Notes (Signed)
Pt 3 hr abnl; reviewed need for diabetic teaching and need to check blood sugars; appt with diabetic educator on Monday with appt with provider one week following BG log 9/15.

## 2012-11-30 ENCOUNTER — Encounter: Payer: Medicaid Other | Attending: Family | Admitting: *Deleted

## 2012-11-30 ENCOUNTER — Ambulatory Visit: Payer: Medicaid Other | Admitting: *Deleted

## 2012-11-30 VITALS — Wt 172.0 lb

## 2012-11-30 DIAGNOSIS — Z713 Dietary counseling and surveillance: Secondary | ICD-10-CM | POA: Insufficient documentation

## 2012-11-30 DIAGNOSIS — O99810 Abnormal glucose complicating pregnancy: Secondary | ICD-10-CM | POA: Insufficient documentation

## 2012-11-30 NOTE — Progress Notes (Signed)
GDM Education: General GDM education provided. True Track glucometer dispensed, instruction per teach back. Dispensed  1bx lancets, 2bx test strips. Patient to test 4Xdaily.

## 2012-11-30 NOTE — Progress Notes (Signed)
Nutrition note: GDM diet education Pt has h/o obesity and is a newly diagnosed GDM pt. Pt has gained 8# @ [redacted]w[redacted]d, which is wnl. Pt reports eating 3 meals & 1 snack/d. Pt is taking PNV. Pt reports no N/V or heartburn. Pt received verbal & written education via Spanish interpreter, Darien Ramus, about GDM diet. Discussed wt gain goals of 11-20# or 0.5#/wk. Pt agrees to continue taking PNV and follow GDM diet with 3 meals & 3 snacks/d with proper CHO/ protein combination. Pt does not have WIC but plans to apply. Pt plans to BF. F/u in 2-4 wks Blondell Reveal, MS, RD, LDN

## 2012-12-02 ENCOUNTER — Encounter: Payer: Self-pay | Admitting: *Deleted

## 2012-12-07 ENCOUNTER — Ambulatory Visit (INDEPENDENT_AMBULATORY_CARE_PROVIDER_SITE_OTHER): Payer: Medicaid Other | Admitting: Obstetrics & Gynecology

## 2012-12-07 DIAGNOSIS — O09529 Supervision of elderly multigravida, unspecified trimester: Secondary | ICD-10-CM

## 2012-12-07 DIAGNOSIS — Z3480 Encounter for supervision of other normal pregnancy, unspecified trimester: Secondary | ICD-10-CM

## 2012-12-07 DIAGNOSIS — O0993 Supervision of high risk pregnancy, unspecified, third trimester: Secondary | ICD-10-CM

## 2012-12-07 LAB — POCT URINALYSIS DIP (DEVICE)
Glucose, UA: NEGATIVE mg/dL
Nitrite: NEGATIVE
Specific Gravity, Urine: 1.015 (ref 1.005–1.030)
Urobilinogen, UA: 0.2 mg/dL (ref 0.0–1.0)

## 2012-12-07 MED ORDER — GLYBURIDE 2.5 MG PO TABS: 2.5000 mg | ORAL_TABLET | Freq: Every day | ORAL | Status: DC

## 2012-12-07 NOTE — Progress Notes (Signed)
Pulse: 61

## 2012-12-07 NOTE — Progress Notes (Signed)
Had DM teaching and has her results today. FBS mostly >96-119, PP up to 143. Will begin glyburide 2.5 mg hs

## 2012-12-07 NOTE — Patient Instructions (Addendum)
Diabetes mellitus gestacional   (Gestational Diabetes Mellitus)  La diabetes mellitus gestacional, más comúnmente conocida como diabetes gestacional es un tipo de diabetes que desarrollan algunas mujeres durante el embarazo. En la diabetes gestacional, el páncreas no produce suficiente insulina (una hormona), las células son menos sensibles a la insulina que se produce (resistencia a la insulina), o ambos. Normalmente, la insulina mueve los azúcares de los alimentos a las células de los tejidos. Las células de los tejidos utilizan los azúcares para obtener energía. La falta de insulina o la falta de una respuesta normal a la insulina hace que el exceso de azúcar se acumule en la sangre en lugar de penetrar en las células de los tejidos. Como resultado, se desarrollan los niveles altos de azúcar en la sangre (hiperglucemia). El efecto de los niveles altos de azúcar (glucosa) puede causar muchas complicaciones.   FACTORES DE RIESGO  Usted tiene mayor probabilidad de desarrollar diabetes gestacional si tiene antecedentes familiares de diabetes y también si tiene uno o más de los siguientes factores de riesgo:   · Índice de masa corporal superior a 30 (obesidad).  · Embarazo previo con diabetes gestacional.  · Mayor edad en el momento del embarazo.  Si se mantienen los niveles de glucosa en sangre en un rango normal durante el embarazo, las mujeres pueden tener un embarazo saludable. Si no controla bien sus niveles de glucosa en sangre, pueden tener tener riesgos usted, su bebé antes de nacer (feto), el trabajo de parto y el parto, o el bebé recién nacido.   SÍNTOMAS   Si se presentan síntomas, éstos son similares a los síntomas que normalmente experimentará durante el embarazo. Los síntomas de la diabetes gestacional son:   · Aumento de la sed (polidipsia).  · Aumento de ganas de orinar (poliuria).  · Aumento de ganas de orinar durante la noche (nicturia).  · Pérdida de peso. Pérdida de peso que puede ser muy  rápida.  · Infecciones frecuentes y recurrentes.  · Cansancio (fatiga).  · Debilidad.  · Cambios en la visión, como visión borrosa.  · Olor a fruta en el aliento.  · Dolor abdominal.  DIAGNÓSTICO  La diabetes se diagnostica cuando hay aumento de los niveles de glucosa en la sangre. El nivel de glucosa en la sangre puede controlarse en uno o más de los siguientes análisis de sangre:   · Medición de glucosa en sangre en ayunas. No deberá comer durante al menos 8 horas antes de que se tome la muestra de sangre.  · Análisis al azar de glucosa en sangre. El nivel de glucosa en sangre se controla en cualquier momento del día sin importar el momento en que haya comido.  · Pruebas de glucosa de sangre de hemoglobina A1c. Un análisis de la hemoglobina A1c proporciona información sobre el control de la glucosa en la sangre durante los últimos 3 meses.  · Prueba oral de tolerancia a la glucosa (SOG). La glucosa en la sangre se mide después de no haber comido (ayuno) durante 1-3 horas y después de beber una bebida que contiene glucosa. Dado que las hormonas que causan la resistencia a la insulina son más altas alrededor de las semanas 24-28 de embarazo, generalmente se realiza un SOG durante ese tiempo. Si tiene factores de riesgo para la diabetes gestacional, su médico puede detectarla antes de las 24 semanas de embarazo.  TRATAMIENTO   · Usted tendrá que tomar medicamentos para la diabetes o insulina diariamente para mantener los niveles de glucosa en sangre   en el rango deseado.  · Usted tendrá que hacer coincidir la dosis de insulina con el ejercicio y la elección de alimentos saludables.  El objetivo del tratamiento es mantener el nivel de glucosa en sangre en 60-99 mg / dl antes de la comida (preprandial), a la hora de acostarse y durante la noche mientras dure su embarazo. El objetivo del tratamiento es mantener el mayor pico de azúcar en sangre después de la comida (glucosa postprandial) en 100-140 mg/dl.   INSTRUCCIONES  PARA EL CUIDADO EN EL HOGAR   · Haga que su nivel de hemoglobina A1c sea verificado dos veces al año.  · Realice un control diario de glucosa en sangre según las indicaciones de su médico. Es común realizar controles con frecuencia de la glucosa en sangre.  · Supervise las cetonas en la orina cuando esté enferma y según las indicaciones de su médico.  · Tome su medicamento para la diabetes y la insulina según las indicaciones de su médico para mantener el nivel de glucosa en sangre en su rango deseado.  · Nunca se quede sin medicamentos para la diabetes o sin insulina. Es necesario recibirla todos los días.  · Ajuste la insulina sobre la base de la ingesta de hidratos de carbono. Los hidratos de carbono pueden aumentar los niveles de glucosa en la sangre, pero deben incluirse en su dieta. Los hidratos de carbono aportan vitaminas, minerales y fibra que son una parte esencial de una dieta saludable. Los hidratos de carbono se encuentran en frutas, verduras, granos enteros, productos lácteos, legumbres y alimentos que contienen azúcares añadidos.  ·   · Consuma alimentos saludables. Alterne 3 comidas con 3 colaciones.  · Mantenga un aumento de peso saludable. El aumento del peso total varía de acuerdo con el índice de masa corporal antes del embarazo ([facility]).  · Lleve una tarjeta de alerta médica o lleve una pulsera de alerta médica.  · Lleve consigo un refrigerio de 15 gramos de hidrato de carbonos en todo momento para controlar los niveles bajos de glucosa en sangre (hipoglucemia). Algunos ejemplos de aperitivos de 15 gramos de hidratos de carbono son:  · Tabletas de glucosa, 3 o 4  ·   Gel de glucosa, tubo de 15 gramos  · Pasas de uva, 2 cucharadas (24 g)  · Caramelos de goma, 6  · Galletas de animales, 8  · Jugo de fruta, soda regular, o leche baja en grasa, 4 onzas (120 ml)  · Pastillas gomitas, 9  ·   · Reconocer la hipoglucemia. Durante el embarazo la hipoglucemia se produce cuando hay niveles de glucosa en  sangre de 60 mg/dl o menos. El riesgo de hipoglucemia aumenta durante el ayuno o saltarse las comidas, durante o después del ejercicio intenso, y durante el sueño. Los síntomas de hipoglucemia son:  · Temblores o sacudidas.  · Disminución de capacidad de concentración.  · Sudoración.  · Aumento en el ritmo cardíaco  · Dolor de cabeza.  · Boca seca.  · Hambre.  · Irritabilidad.  · Ansiedad.  · Sueño agitado.  · Alteración del habla o de la coordinación.  · Confusión.  · Tratar la hipoglucemia rápidamente. Si usted está alerta y puede tragar con seguridad, siga la regla de 15:15:  · Tome de 15 a 20 gramos de glucosa de acción rápida o hidratos de carbono . Las opciones de acción rápida son un gel de glucosa, unas tabletas de glucosa, o 4 onzas (120 ml) de jugo de frutas, soda   regular, o leche baja en grasa.  · Compruebe su nivel de glucosa en sangre 15 minutos después de tomar la glucosa.  ·   Tome de 15 a 20 gramos más de glucosa si al repetir el nivel de glucosa en la sangre todavía es de 70 mg / dl o inferior.  · Coma una comida o una colación dentro de 1 hora una vez que los niveles de glucosa en la sangre vuelven a la normalidad.  · Esté alerta a la poliuria y polidipsia, que son los primeros signos de hiperglucemia. El conocimiento temprano de la hiperglucemia permite un tratamiento oportuno. Controle la hiperglucemia según le indicó su médico.  · Haga actividad física por lo menos 30 minutos al día o como lo indique su médico. Se recomienda diez minutos de actividad física cronometrados 30 minutos después de cada comida para controlar los niveles de glucosa en sangre postprandial.  · Ajuste su dosis de insulina y la ingesta de alimentos, según sea necesario, si se inicia un nuevo ejercicio o deporte.  · Siga su plan diario de enfermo en algún momento que no puede comer o beber como de costumbre.  · Evite el tabaco y el alcohol.  · Concurra regularmente a las visitas de control con el médico.  · Siga el consejo  de su médico respecto a los controles prenatales y posteriores al parto (post-parto), a la planificación de las comidas, al ejercicio, a los medicamentos, a las vitaminas, a los análisis de sangre, a otras pruebas médicas y físicas.  · Cuide diariamente la piel y los pies. Examine su piel y los pies diariamente para detectar cortes, moretones, enrojecimiento, problemas en las uñas, sangrado, ampollas o llagas.  · Cepíllese los dientes y encías por lo menos dos veces al día y use hilo dental al menos una vez por día. Concurra regularmente a las visitas de control con el dentista.  · Programe un examen de vista durante el primer trimestre de su embarazo o como lo indique su médico.  · Comparta su plan de control de diabetes en su trabajo o en la escuela.  · Manténgase al día con las vacunas.  · Aprenda a manejar el estrés.  · Obtenga educación continuada y ayuda para la diabetes cuando sea necesario.  SOLICITE ATENCIÓN MÉDICA SI:   · No puede comer alimentos o beber por más de 6 horas.  · Tiene náuseas o ha vomitado durante más de 6 horas.  · Tiene un nivel de glucosa en sangre de 200 mg/dl y cetonas en la orina.  · Presenta algún cambio en el estado mental.  · Tiene problemas de visión.  · Sufre un dolor persistente de cabeza.  · Tiene dolor o molestias en el abdomen.  · Desarrolla una enfermedad grave adicional.  · Tiene diarrea durante más de 6 horas.  · Ha estado enferma o ha tenido fiebre durante un par de días y no mejora.  SOLICITE ATENCIÓN MÉDICA DE INMEDIATO SI:   · Tiene dificultad para respirar.  · Ya no siente los movimientos del bebé.  · Está sangrando o tiene flujo vaginal.  · Comienza a tener contracciones o trabajo de parto prematuro.  ASEGÚRESE DE QUE:  · Comprende estas instrucciones.  · Controlará su enfermedad.  · Solicitará ayuda de inmediato si no mejora o si empeora.  Document Released: 12/19/2004 Document Revised: 12/04/2011  Exit[employer]® Patient Information ©2014 [employer].

## 2012-12-08 ENCOUNTER — Encounter: Payer: Self-pay | Admitting: *Deleted

## 2012-12-14 ENCOUNTER — Ambulatory Visit (INDEPENDENT_AMBULATORY_CARE_PROVIDER_SITE_OTHER): Payer: Medicaid Other | Admitting: Obstetrics & Gynecology

## 2012-12-14 VITALS — BP 97/57 | Wt 173.7 lb

## 2012-12-14 DIAGNOSIS — O09529 Supervision of elderly multigravida, unspecified trimester: Secondary | ICD-10-CM

## 2012-12-14 DIAGNOSIS — Z758 Other problems related to medical facilities and other health care: Secondary | ICD-10-CM | POA: Insufficient documentation

## 2012-12-14 DIAGNOSIS — O99810 Abnormal glucose complicating pregnancy: Secondary | ICD-10-CM

## 2012-12-14 DIAGNOSIS — Z789 Other specified health status: Secondary | ICD-10-CM | POA: Insufficient documentation

## 2012-12-14 DIAGNOSIS — Z609 Problem related to social environment, unspecified: Secondary | ICD-10-CM

## 2012-12-14 DIAGNOSIS — O24419 Gestational diabetes mellitus in pregnancy, unspecified control: Secondary | ICD-10-CM

## 2012-12-14 DIAGNOSIS — Z3480 Encounter for supervision of other normal pregnancy, unspecified trimester: Secondary | ICD-10-CM

## 2012-12-14 LAB — POCT URINALYSIS DIP (DEVICE)
Bilirubin Urine: NEGATIVE
Glucose, UA: NEGATIVE mg/dL
Nitrite: NEGATIVE

## 2012-12-14 NOTE — Progress Notes (Signed)
Pulse: 61

## 2012-12-14 NOTE — Progress Notes (Signed)
Patient is Spanish-speaking only, Spanish interpreter present for this encounter.  NST performed today was reviewed and was found to be reactive.  Continue recommended antenatal testing and prenatal care.  BS mostly within range as per patient, forgot log book, will bring next visit.  Continue Glyburide.  Counseled about Tdap, she will consider this recommendation.  No other complaints or concerns.  Fetal movement and labor precautions reviewed.

## 2012-12-14 NOTE — Patient Instructions (Signed)
Vacuna difteria/ttanos (Td) o [place] difteria, ttanos, tos convulsa (Tdap), Lo que debe saber (Tetanus, Diphtheria [Td] or Tetanus, Diphtheria, Pertussis [Tdap] Vaccine, What You Need to Know) PORQU VACUNARSE? El ttanos , la difteria y la tos ferina pueden ser enfermedades graves.  El TTANOS  (trismo) provoca la contraccin dolorosa y rigidez de los msculos, por lo general, en todo el cuerpo.   Puede causar la contraccin de los msculos de la cabeza y el cuello de modo que el enfermo no puede abrir la boca ni tragar., y en algunos casos, tampoco puede respirar.. El ttanos causa la muerte de 1 de cada 5 personas que se infectan. LA DIFTERIA produce la formacin de una membrana gruesa que cubre el fondo de la garganta.  Puede causar problemas respiratorios, parlisis, insuficiencia cardaca, e incluso la muerte. El PERTUSIS (tos [place]) causa ataques de tos intensa que pueden dificultar la respiracin, provocar vmitos e interrumpir el sueo.   Puede causar prdida de peso, incontinencia, fractura de [place], y desmayos por la intensa tos. Hasta de 2 de cada 100 adolescentes y 5 de cada 100 adultos que enferman de tos [place] deben ser hospitalizados o tienen complicaciones como la neumona y la [place]. Estas 3 enfermedades son provocadas por bacterias. La difteria y la tos Benetta Spar se [place] de persona a Social worker. El ttanos ingresa al organismo a travs de cortes, rasguos o heridas. En los [place] ocurran alrededor de 200 000 casos por ao de difteria y tos [place], antes de que existieran las [place], y tambin ocurran cientos de casos de ttanos. Desde la aparicin de las vacunas, el ttanos y la difteria han disminuido en alrededor del 99% y los casos de tos ferina disminuyeron aproximadamente el 92%.  Los nios menores de 6 aos deben recibir la vacuna DTaP para estar protegidos contra estas tres enfermedades. Pero los [employer], los adolescentes y los adultos tambin  necesitan proteccin. VACUNAS PARA ADOLESCENTES Y ADULTOS Vacunas Tdap y Td  Hay dos vacunas disponibles para proteger de estas enfermedades a nios a Glass blower/designer de los 7aos:   La vacuna Td fue utilizada durante muchos aos. Protege contra el ttanos y la difteria.  La vacuna Tdap fue autorizada en 2005. Es la primera vacuna para adolescentes y adultos que protege contra la tos ferina y el ttanos y la difteria. Una dosis de refuerzo de la Td se recomienda cada 10 aos. La Tdap se aplica slo una vez.  QU VACUNA DEBO APLICARME Y CUANDO? Las edades de 7 a 18 aos  Dynegy 11 y los 12 aos se recomienda una dosis de Tdap. Esta dosis puede aplicarse desde los 7 aos en los nios que no han recibido una o ms dosis de DTaP anteriormente.  Los nios y adolescentes que no recibieron todas las dosis programadas de DTaP o DTP a los 7 aos deben completar la serie usando una combinacin de Td y Tdap. Adultos de 19 aos o ms  [employer] adultos deben recibir una dosis de refuerzo de Td cada 10 aos. Los adultos de menos de 65 aos que nunca hayan recibido la Tdap deben reemplazarla por la siguiente dosis de refuerzo. Los adultos a partir de los 65 aos puedenrecibir una dosis de Tdap.  Los adultos (incluyendo las mujeres que podran quedar embarazadas y los adultos mayores de 65 aos) que tienen contacto cercano con un beb menor de 12 meses deben aplicarse una dosis de Tdap para proteger al beb de la tos [place].  Los trabajadores de  la salud que tengan contacto directo con pacientes en hospitales o clnicas deben recibir una dosis de Tdap. Proteccin despus de [place] herida  Es posible que una persona que tenga un corte o quemadura grave necesite una dosis de Td o Tdap para prevenir la infeccin por ttanos. Puede usarse la Tdap en personas que nunca recibieron una dosis. Pero debe usarse la Td, si la Tdap no se encuentra disponible, o para:  Cualquier persona que haya recibido una dosis de  Tdap.  Los nios [employer] 7 y los 9 aos que han [employer] series de DTap anteriormente.  Adultos de 65 aos o ms. Mujeres embarazadas.   Las mujeres embarazadas que nunca recibieron una dosis de Ddap deben recibirla despus de la 20a semana de gestacin y preferiblemente durante Contractor. trimestre. Si no se aplican la Tdap durante el embarazo, deben recibirla lo antes posible despus del parto. Las mujeres embarazadas que han recibido la Tdap y tienen que aplicarse la vacuna contra el ttanos o la difteria durante el [place], deben recibir la Td. Las vacunas Tdap y Td pueden ser administradas al mismo tiempo que otras vacunas. ALGUNAS PERSONAS NO DEBEN RECIBIR LA VACUNA O DEBEN [employer]  Las personas que hayan tenido una reaccin alrgica que haya puesto en peligro su vida despus de una dosis de vacuna contra el ttanos, la difteria o la tos ferina no deben recibir Td ni Tdap..  Las personas que tengan alergias graves a algn componente de una vacuna no deben recibir esa vacuna. Informe a su mdico si la persona que recibe la vacuna sufre alergias graves.  Cualquier persona que [employer] en coma o que haya tenido convulsiones dentro de los 7 [address]  [address] posteriores despus de una dosis de DTP o DTaP no debe recibir la Tdap, salvo que se encuentre una causa que no fuera la vacuna. Estas personas pueden recibir Td.  Consulte a su mdico si la persona que recibe [place] de las vacunas:  Tiene epilepsia o algn otro problema del sistema nervioso.  Tuvo inflamacin o dolor intenso despus de una dosis de DTP, DTaP, DT, Td, o Tdap.  Ha tenido el sndrome de Scientific laboratory technician (GBS por sus siglas en ingls). Las personas que sufran una enfermedad moderada o grave el da en que se programa la vacuna, deben esperar a recuperarse para recibir las vacunas Tdap o Td. Por lo general, una persona con una enfermedad leve o fiebre baja puede recibir la vacuna. CULES SON LOS RIESGOS DE LAS VACUNAS TDAP Y  TD? Con Cathleen Corti, al igual que con cualquier [employer], siempre existe un pequeo riesgo de una reaccin alrgica que ponga en peligro la vida o cause otro problema grave. Todo procedimiento mdico, inclusive la vacunacin pueden causar breves episodios de lipotimia o sntomas relacionados (como movimientos espasmdicos). Para evitar los [employer] y las lesiones causadas por las cadas, permanezca sentado o recustese durante los 15 minutos posteriores a la vacunacin. Informe a su mdico si el paciente se siente dbil o mareado, tiene cambios en la visin o siente zumbidos en los odos.  Es mucho ms probable que tener ttanos, difteria, o tos ferina cause problemas ms graves que los provocados por recibir cualquiera de las vacunas Td o Tdap. A continuacin se enumeran los problemas informados despus de las vacunas Td y Tdap. Problemas Leves (perceptibles, pero que no interfirieron con las actividades): Tdap  Dolor (alrededor de 3 de cada 4 adolescentes y 2 de cada 3 adultos).  Enrojecimiento o inflamacin en  el sitio de la inyeccin (alrededor de 1 de cada 5).  Fiebre leve de al menos 100.4 F (38 C) (hasta alrededor de 1 cada 25 adolescentes y 1 de cada 100 adultos).  Dolor de cabeza (alrededor de 4 de cada 10 adolescentes y 3 de cada 10 adultos).  Cansancio (alrededor de 1 de cada 3 adolescentes y 1 de cada 4 adultos).  Nuseas, vmitos, diarrea, o dolor de estmago (hasta 1 de cada 4 adolescentes y 1 de cada 10 adultos).  Escalofros, dolores corporales, dolor articular, erupciones, o inflamacin de las glndulas (poco frecuente). Td  Dolor (hasta alrededor de 8 de cada 10).  Enrojecimiento o inflamacin de la inyeccin (alrededor de 1 de cada 3).  Fiebre leve (hasta alrededor de 1 de cada 5).  Dolor de cabeza o cansancio (poco frecuente). Problemas Moderados (interfieren con las [place], [place] no requieren atencin mdica): Tdap  Dolor en el sitio de la inyeccin  (alrededor de 1 de cada 20 adolescentes y 1 de cada 100 adultos).  Enrojecimiento o inflamacin de la inyeccin (alrededor de 1 de cada 16 adolescentes y 1 de cada 25 adultos).  Fiebre de ms de 102 F (38.9 C) (alrededor de 1 de cada 100 adolescentes y 1 de cada 250 adultos).  Dolor de cabeza (1 de cada 300).  Nuseas, vmitos, diarrea, o dolor de estmago (hasta 3 de cada 100 adolescentes y 1 de cada 100 adultos). Td  Fiebre de ms de 102 F (38.9 C) (poco comn). Tdap o Td  Inflamacin de gran extensin en el brazo en el que se aplic la vacuna (hasta 3 de cada 100). Problemas Graves (no puede realizar [employer]; requiere Psychologist, prison and probation services) Tdap o Td  Inflamacin, dolor intenso, sangrado y enrojecimiento en el brazo, en el sitio de la inyeccin (poco frecuente). Puede producirse una reaccin alrgica grave despus de cualquier vacuna. Se estima que estas reacciones ocurren en menos de una de cada un milln de dosis. QU PASA SI HAY UNA REACCIN GRAVE? Qu signos debo buscar? Cualquier estado poco habitual, como una reaccin alrgica grave o fiebre alta. Si le produce Runner, broadcasting/film/video grave, se manifestar dentro de algunos minutos a una hora despus de recibir la vacuna. Entre los signos de Automotive engineer grave se encuentran la dificultad para respirar, debilidad, ronquera o sibilancias, latidos cardacos acelerados, urticaria, mareos, palidez, o inflamacin de la garganta. Qu debo hacer?  Comunquese con su mdico o lleve inmediatamente a la persona al mdico.  Dgale a su mdico qu ocurri, la fecha y hora en que sucedi y cundo le aplicaron la vacuna.  Pida a su mdico que informe sobre la reaccin llenando un formulario del Sistema de Informacin de Reacciones Adversos a las Administrator, arts (VAERS, por sus siglas en ingls). O, puede presentar este informe a travs del sitio web de VAERS [email or web address].vaers.LAgents.no o puede llamar al [phone]. VAERS no brinda  asistencia mdica. PROGRAMA NACIONAL DE COMPENSACIN DE DAOS POR VACUNAS El [place] de Compensacin de Daos por Vacunas (VICP) fue creado en 1986.  Aquellas personas que consideren que han sufrido un dao como consecuencia de una vacuna y quieren saber ms acerca del programa y como presentar Roslynn Amble, [place] llamar al [phone] o visitar su sitio web en SpiritualWord.at  CMO Roxan Diesel MS INFORMACIN?  El profesional podr darle el prospecto de la vacuna o sugerirle otras fuentes de informacin.  Comunquese con el servicio de salud de su localidad o [address].  Comunquese con los Centros para el  control y la prevencin de Child psychotherapist for Disease Control and Prevention , CDC).  Llame al (626) [phone] (1-800-CDC-INFO).  Visite los sitios web de [employer] en PicCapture.uy CDC Td and Tdap Interim VIS-Spanish (04/17/10) Document Released: 06/27/2008 Document Revised: 06/03/2011 ExitCare Patient Information 2014 [place], [place].

## 2012-12-18 ENCOUNTER — Ambulatory Visit (INDEPENDENT_AMBULATORY_CARE_PROVIDER_SITE_OTHER): Payer: Self-pay | Admitting: *Deleted

## 2012-12-18 VITALS — BP 112/58

## 2012-12-18 DIAGNOSIS — O0990 Supervision of high risk pregnancy, unspecified, unspecified trimester: Secondary | ICD-10-CM

## 2012-12-18 DIAGNOSIS — O99810 Abnormal glucose complicating pregnancy: Secondary | ICD-10-CM

## 2012-12-18 DIAGNOSIS — O24419 Gestational diabetes mellitus in pregnancy, unspecified control: Secondary | ICD-10-CM

## 2012-12-18 NOTE — Progress Notes (Signed)
NST reviewed and reactive.  Chinaza Rooke L. Harraway-Smith, M.D., FACOG    

## 2012-12-18 NOTE — Progress Notes (Signed)
P-58 

## 2012-12-21 ENCOUNTER — Ambulatory Visit (INDEPENDENT_AMBULATORY_CARE_PROVIDER_SITE_OTHER): Payer: Medicaid Other | Admitting: Obstetrics & Gynecology

## 2012-12-21 VITALS — BP 108/58 | Wt 173.6 lb

## 2012-12-21 DIAGNOSIS — Z3480 Encounter for supervision of other normal pregnancy, unspecified trimester: Secondary | ICD-10-CM

## 2012-12-21 DIAGNOSIS — O0993 Supervision of high risk pregnancy, unspecified, third trimester: Secondary | ICD-10-CM

## 2012-12-21 DIAGNOSIS — O24419 Gestational diabetes mellitus in pregnancy, unspecified control: Secondary | ICD-10-CM

## 2012-12-21 DIAGNOSIS — O99810 Abnormal glucose complicating pregnancy: Secondary | ICD-10-CM

## 2012-12-21 LAB — POCT URINALYSIS DIP (DEVICE)
Bilirubin Urine: NEGATIVE
Glucose, UA: NEGATIVE mg/dL
Ketones, ur: NEGATIVE mg/dL

## 2012-12-21 MED ORDER — GLYBURIDE 2.5 MG PO TABS: 5.0000 mg | ORAL_TABLET | Freq: Every day | ORAL | Status: DC

## 2012-12-21 NOTE — Progress Notes (Signed)
Fasting 97-119.  Most post prandials are WNL.  Increase glyburide to 5 mg qhs.  Continue 2xweek testing.   Reactive NST today.

## 2012-12-21 NOTE — Progress Notes (Signed)
P = 59   Pt has itching when she urinates x2 days.

## 2012-12-25 ENCOUNTER — Ambulatory Visit (INDEPENDENT_AMBULATORY_CARE_PROVIDER_SITE_OTHER): Payer: Medicaid Other | Admitting: *Deleted

## 2012-12-25 VITALS — BP 111/55

## 2012-12-25 DIAGNOSIS — O24419 Gestational diabetes mellitus in pregnancy, unspecified control: Secondary | ICD-10-CM

## 2012-12-25 DIAGNOSIS — B373 Candidiasis of vulva and vagina: Secondary | ICD-10-CM

## 2012-12-25 DIAGNOSIS — O99810 Abnormal glucose complicating pregnancy: Secondary | ICD-10-CM

## 2012-12-25 LAB — POCT URINALYSIS DIP (DEVICE)
Glucose, UA: NEGATIVE mg/dL
Specific Gravity, Urine: 1.015 (ref 1.005–1.030)
Urobilinogen, UA: 0.2 mg/dL (ref 0.0–1.0)

## 2012-12-25 MED ORDER — FLUCONAZOLE 150 MG PO TABS: 150.0000 mg | ORAL_TABLET | Freq: Once | ORAL | Status: DC

## 2012-12-25 MED ORDER — NYSTATIN 100000 UNIT/GM EX CREA: TOPICAL_CREAM | Freq: Two times a day (BID) | CUTANEOUS | Status: DC

## 2012-12-25 MED ORDER — TRIAMCINOLONE ACETONIDE 0.025 % EX OINT: TOPICAL_OINTMENT | Freq: Two times a day (BID) | CUTANEOUS | Status: DC

## 2012-12-25 NOTE — Progress Notes (Signed)
P = 63   Spanish interpreter - Dori present for pt visit today.  Pt continues to report itching with urination as well as vaginal itching- external and internal.  She denies vaginal d/c.  Small leukocytes on u/a today- specimen sent for culture.  Discussed pt with Joseph Berkshire PA- she will do wet prep today.

## 2012-12-25 NOTE — Progress Notes (Signed)
NST performed today was reviewed and was found to be reactive.  Normal AFI at 15.3 cm.  Continue recommended antenatal testing and prenatal care.

## 2012-12-25 NOTE — Progress Notes (Signed)
Patient in [facility] clinic for NST. Expressed concerns about vaginal itching without discharge. Vaginal exams revels moderate amount of thick, white-yellow discharge coating the walls of the vagina. Rx for Diflucan, Nystatin and Triamcinolone cream given to patient. Patient advised that Nystatin and Triamcinolone are for external use only. Patient voiced understanding. Spanish interpreter Burman Blacksmith was present for the visit.

## 2012-12-26 LAB — CULTURE, OB URINE: Colony Count: 15000

## 2012-12-26 LAB — WET PREP, GENITAL
Clue Cells Wet Prep HPF POC: NONE SEEN
Trich, Wet Prep: NONE SEEN

## 2012-12-28 ENCOUNTER — Ambulatory Visit (INDEPENDENT_AMBULATORY_CARE_PROVIDER_SITE_OTHER): Payer: Medicaid Other | Admitting: Obstetrics and Gynecology

## 2012-12-28 VITALS — BP 97/58

## 2012-12-28 DIAGNOSIS — O09529 Supervision of elderly multigravida, unspecified trimester: Secondary | ICD-10-CM

## 2012-12-28 DIAGNOSIS — O24419 Gestational diabetes mellitus in pregnancy, unspecified control: Secondary | ICD-10-CM

## 2012-12-28 DIAGNOSIS — Z789 Other specified health status: Secondary | ICD-10-CM

## 2012-12-28 DIAGNOSIS — O0993 Supervision of high risk pregnancy, unspecified, third trimester: Secondary | ICD-10-CM

## 2012-12-28 DIAGNOSIS — O99810 Abnormal glucose complicating pregnancy: Secondary | ICD-10-CM

## 2012-12-28 DIAGNOSIS — Z609 Problem related to social environment, unspecified: Secondary | ICD-10-CM

## 2012-12-28 LAB — POCT URINALYSIS DIP (DEVICE)
Bilirubin Urine: NEGATIVE
Glucose, UA: NEGATIVE mg/dL
Ketones, ur: NEGATIVE mg/dL
Nitrite: NEGATIVE
Protein, ur: NEGATIVE mg/dL
Specific Gravity, Urine: 1.01 (ref 1.005–1.030)

## 2012-12-28 NOTE — Progress Notes (Signed)
P = 73     Pt reports that she took the diflucan on Friday and is using the creams that were prescribed  but still has itching of vagina.

## 2012-12-28 NOTE — Progress Notes (Signed)
NST reviewed and reactive. Patient doing well without complaints. Not aware of contractions. CBG's majority within range, a few fasting in 100's range. Discussed consuming a protein rich snack at bedtime

## 2013-01-01 ENCOUNTER — Ambulatory Visit (INDEPENDENT_AMBULATORY_CARE_PROVIDER_SITE_OTHER): Payer: Medicaid Other | Admitting: General Practice

## 2013-01-01 VITALS — BP 121/62

## 2013-01-01 DIAGNOSIS — O99810 Abnormal glucose complicating pregnancy: Secondary | ICD-10-CM

## 2013-01-01 NOTE — Progress Notes (Signed)
Pulse: 76

## 2013-01-04 ENCOUNTER — Inpatient Hospital Stay (HOSPITAL_COMMUNITY)
Admission: AD | Admit: 2013-01-04 | Discharge: 2013-01-04 | Disposition: A | Discharge status: Home / Self Care | Payer: Medicaid Other | Source: Ambulatory Visit | Attending: Obstetrics & Gynecology | Admitting: Obstetrics & Gynecology

## 2013-01-04 ENCOUNTER — Ambulatory Visit (INDEPENDENT_AMBULATORY_CARE_PROVIDER_SITE_OTHER): Payer: Medicaid Other | Admitting: Obstetrics & Gynecology

## 2013-01-04 ENCOUNTER — Encounter (HOSPITAL_COMMUNITY): Payer: Self-pay | Admitting: *Deleted

## 2013-01-04 VITALS — BP 106/47 | Temp 97.2°F | Wt 117.2 lb

## 2013-01-04 DIAGNOSIS — Z789 Other specified health status: Secondary | ICD-10-CM

## 2013-01-04 DIAGNOSIS — O26859 Spotting complicating pregnancy, unspecified trimester: Secondary | ICD-10-CM | POA: Insufficient documentation

## 2013-01-04 DIAGNOSIS — R1084 Generalized abdominal pain: Secondary | ICD-10-CM

## 2013-01-04 DIAGNOSIS — O99810 Abnormal glucose complicating pregnancy: Secondary | ICD-10-CM

## 2013-01-04 DIAGNOSIS — O0993 Supervision of high risk pregnancy, unspecified, third trimester: Secondary | ICD-10-CM

## 2013-01-04 DIAGNOSIS — O4703 False labor before 37 completed weeks of gestation, third trimester: Secondary | ICD-10-CM

## 2013-01-04 DIAGNOSIS — O24419 Gestational diabetes mellitus in pregnancy, unspecified control: Secondary | ICD-10-CM

## 2013-01-04 DIAGNOSIS — O0990 Supervision of high risk pregnancy, unspecified, unspecified trimester: Secondary | ICD-10-CM

## 2013-01-04 DIAGNOSIS — Z609 Problem related to social environment, unspecified: Secondary | ICD-10-CM

## 2013-01-04 DIAGNOSIS — E119 Type 2 diabetes mellitus without complications: Secondary | ICD-10-CM | POA: Insufficient documentation

## 2013-01-04 DIAGNOSIS — O24919 Unspecified diabetes mellitus in pregnancy, unspecified trimester: Secondary | ICD-10-CM | POA: Insufficient documentation

## 2013-01-04 DIAGNOSIS — R109 Unspecified abdominal pain: Secondary | ICD-10-CM | POA: Insufficient documentation

## 2013-01-04 DIAGNOSIS — O09529 Supervision of elderly multigravida, unspecified trimester: Secondary | ICD-10-CM

## 2013-01-04 DIAGNOSIS — O479 False labor, unspecified: Secondary | ICD-10-CM

## 2013-01-04 LAB — OB RESULTS CONSOLE GC/CHLAMYDIA
Chlamydia: NEGATIVE
Gonorrhea: NEGATIVE

## 2013-01-04 LAB — POCT URINALYSIS DIP (DEVICE)
Bilirubin Urine: NEGATIVE
Glucose, UA: NEGATIVE mg/dL
Ketones, ur: NEGATIVE mg/dL
Specific Gravity, Urine: 1.01 (ref 1.005–1.030)
Urobilinogen, UA: 0.2 mg/dL (ref 0.0–1.0)

## 2013-01-04 MED ORDER — GLYBURIDE 2.5 MG PO TABS: 7.5000 mg | ORAL_TABLET | Freq: Every day | ORAL | Status: DC

## 2013-01-04 NOTE — MAU Provider Note (Signed)
History     CSN: [id number]  Arrival date and time: 01/04/13 1319   First Provider Initiated Contact with Patient 01/04/13 1649      Chief Complaint  Patient presents with  . Vaginal Bleeding   HPI  Cleotis Nipper is a 37 y.o G4P3003 with A2DM who presents at 36w4 for pink spotting after cervical exam today. Hx is given through interpretor as pt is spanish speaking only. Per pt had routine clinic exam today but then noticed spotting this afternoon. Pt was concerned with bleeding and feelings of abdominal pressure which she had never felt with previous pregnancies. Abd discomfort is described as suprapubic pressure, not consistent intervals, relieved by rest.  OB History   Grav Para Term Preterm Abortions TAB SAB Ect Mult Living   4 3 3  0 0 0 0 0 0 3      History reviewed. No pertinent past medical history.  No past surgical history on file.  Family History  Problem Relation Age of Onset  . Diabetes Mother     History  Substance Use Topics  . Smoking status: Never Smoker   . Smokeless tobacco: Never Used  . Alcohol Use: No    Allergies:  Allergies  Allergen Reactions  . Penicillins Nausea And Vomiting    Prescriptions prior to admission  Medication Sig Dispense Refill  . docusate sodium (COLACE) 100 MG capsule Take 1 capsule (100 mg total) by mouth 2 (two) times daily as needed for constipation.  60 capsule  4  . glyBURIDE (DIABETA) 2.5 MG tablet Take 3 tablets (7.5 mg total) by mouth at bedtime.  60 tablet  1  . nystatin cream (MYCOSTATIN) Apply topically 2 (two) times daily.  30 g  0  . Prenatal Vit-Fe Fumarate-FA (PRENATAL VITAMINS) 28-0.8 MG TABS Take 1 tablet by mouth daily.  90 tablet  3  . triamcinolone (KENALOG) 0.025 % ointment Apply topically 2 (two) times daily.  30 g  0    Review of Systems  Constitutional: Negative for fever and chills.  HENT: Negative for congestion and sore throat.   Eyes: Negative for blurred vision and pain.  Respiratory: Negative  for cough, shortness of breath and wheezing.   Cardiovascular: Negative for chest pain, palpitations and orthopnea.  Gastrointestinal: Positive for abdominal pain. Negative for heartburn, nausea, vomiting and diarrhea.  Genitourinary: Negative for dysuria, urgency and frequency.  Musculoskeletal: Negative for myalgias.  Skin: Negative for rash.  Neurological: Negative for dizziness, focal weakness, loss of consciousness and headaches.  Endo/Heme/Allergies: Does not bruise/bleed easily.  Psychiatric/Behavioral: Negative for depression.   Physical Exam   Blood pressure 116/56, pulse 71, temperature 98.4 F (36.9 C), temperature source Oral, resp. rate 18, height 4' 11.5" (1.511 m), weight 80.287 kg (177 lb).  Physical Exam  Constitutional: She is oriented to person, place, and time. She appears well-developed and well-nourished.  HENT:  Head: Normocephalic and atraumatic.  Eyes: EOM are normal.  Neck: Neck supple.  Cardiovascular: Normal rate.   Respiratory: Effort normal. No respiratory distress.  GI: Soft. There is no tenderness.  Genitourinary:  Cervix- 1, thick, high and long  Musculoskeletal: Normal range of motion. She exhibits no edema.  Neurological: She is alert and oriented to person, place, and time.  Skin: Skin is warm and dry.  Psychiatric: She has a normal mood and affect. Her behavior is normal.    MAU Course  Procedures  MDM Cervical Exam FHR- 130, mod var, accels pres, no decels Uterine irritability  Assessment  and Plan  Pt is a 37 y.o G4P3003 with gestDM, controlled on meds, here at 36w4 with concern for vag spotting after cervical exam today.  1. Vag spotting/abd pain- nml, expected after cervical manipulation during office visit this afternoon; has not had increased or continued bleeding; no regular contraction pattern consistent with labor and cervical exam unchanged from office visit earlier today -pt given reassurance -provided information on false  labor -instructed to call ob or come to MAU if notices increase in bleed or ROM, decreased fetal mvment  2. FHR- reassuring -cat I tracing  3. A2DM- pt controlled on glyburide, dose recently increased to 7.5mg  qhs today in clinic with plan to re-eval next week -keep prenatal appointmt at [facility], Melanie 01/04/2013, 5:00 PM   I spoke with and examined patient and agree with resident's note and plan of care.  Tawana Scale, MD OB Fellow 01/04/2013 8:31 PM

## 2013-01-04 NOTE — Patient Instructions (Addendum)
Regrese a la clinica cuando tenga su cita. Si tiene problemas o preguntas, llama a la clinica o vaya a la sala de emergencia al [facility].    

## 2013-01-04 NOTE — MAU Note (Signed)
Was checked in clinic this morning.  When went to the bathroom, had some spotting.  Was 1 cm, some pelvic pressure

## 2013-01-04 NOTE — Progress Notes (Signed)
Patient is Spanish-speaking only, Spanish interpreter present for this encounter.  CBG review showed elevated fasting and breakfast PP; other PP within range. Increased Glyburide to 7.5 mg qhs, will reevaluate next week.  Pelvic cultures done today.  NST performed today was reviewed and was found to be reactive.  Continue recommended antenatal testing and prenatal care. 38 week growth scan scheduled today.  No other complaints or concerns.  Fetal movement and labor precautions reviewed.

## 2013-01-04 NOTE — Addendum Note (Signed)
Addended by: Jill Side on: 01/04/2013 11:57 AM   Modules accepted: Orders

## 2013-01-04 NOTE — Progress Notes (Signed)
Pulse: 66

## 2013-01-05 LAB — GC/CHLAMYDIA PROBE AMP
CT Probe RNA: NEGATIVE
GC Probe RNA: NEGATIVE

## 2013-01-06 NOTE — Progress Notes (Signed)
NST 01/01/13 reactive

## 2013-01-07 ENCOUNTER — Encounter: Payer: Self-pay | Admitting: Obstetrics & Gynecology

## 2013-01-07 LAB — CULTURE, BETA STREP (GROUP B ONLY)

## 2013-01-08 ENCOUNTER — Ambulatory Visit (INDEPENDENT_AMBULATORY_CARE_PROVIDER_SITE_OTHER): Payer: Medicaid Other | Admitting: *Deleted

## 2013-01-08 VITALS — BP 117/67

## 2013-01-08 DIAGNOSIS — O24419 Gestational diabetes mellitus in pregnancy, unspecified control: Secondary | ICD-10-CM

## 2013-01-08 DIAGNOSIS — O99810 Abnormal glucose complicating pregnancy: Secondary | ICD-10-CM

## 2013-01-08 NOTE — Progress Notes (Addendum)
Dr. Jolayne Panther informed of transverse fetal position- she came to see pt and discussed External Cephalic Version (ECV) if pt wants vaginal delivery.  Pt desires ECV- scheduled for tomorrow @ 0800.  Pt instructed to be NPO after midnight except for water and to arrive to MAU @ 0715

## 2013-01-08 NOTE — Progress Notes (Signed)
Patient noted to have fetus in transverse position. Patient counseled and interested in ECV. Risks, benefits and alternatives were explained. Patient verbalized understanding and is scheduled for 10/18 at 8 am. Patient instructed to arrive at 7:15 am

## 2013-01-08 NOTE — Progress Notes (Signed)
P= 69 Pt c/o of spotting after exam on Monday, visit to MAU for bleeding. Pt reports pressure when ambulating and contractions with activity. Denies any today.

## 2013-01-09 ENCOUNTER — Encounter (HOSPITAL_COMMUNITY): Payer: Self-pay

## 2013-01-09 ENCOUNTER — Inpatient Hospital Stay (HOSPITAL_COMMUNITY)
Admission: RE | Admit: 2013-01-09 | Discharge: 2013-01-09 | DRG: 781 | Disposition: A | Discharge status: Home / Self Care | Payer: Medicaid Other | Source: Ambulatory Visit | Attending: Family Medicine | Admitting: Family Medicine

## 2013-01-09 VITALS — BP 111/61 | HR 68 | Temp 97.8°F | Resp 18 | Ht 59.5 in | Wt 180.0 lb

## 2013-01-09 DIAGNOSIS — O99810 Abnormal glucose complicating pregnancy: Secondary | ICD-10-CM | POA: Diagnosis present

## 2013-01-09 DIAGNOSIS — O24419 Gestational diabetes mellitus in pregnancy, unspecified control: Secondary | ICD-10-CM

## 2013-01-09 DIAGNOSIS — O0990 Supervision of high risk pregnancy, unspecified, unspecified trimester: Secondary | ICD-10-CM

## 2013-01-09 DIAGNOSIS — O322XX0 Maternal care for transverse and oblique lie, not applicable or unspecified: Principal | ICD-10-CM | POA: Diagnosis present

## 2013-01-09 DIAGNOSIS — O322XX1 Maternal care for transverse and oblique lie, fetus 1: Secondary | ICD-10-CM

## 2013-01-09 MED ORDER — TERBUTALINE SULFATE 1 MG/ML IJ SOLN
INTRAMUSCULAR | Status: AC
  Filled 2013-01-09: qty 1

## 2013-01-09 MED ORDER — TERBUTALINE SULFATE 1 MG/ML IJ SOLN
0.2500 mg | Freq: Once | INTRAMUSCULAR | Status: AC
  Administered 2013-01-09: 0.25 mg via SUBCUTANEOUS

## 2013-01-09 NOTE — Progress Notes (Signed)
Dr. Shawnie Pons at bedside to perform external cephalic version--terbutaline given subcutaneous--version successful--orders to keep patient on monitors for at least an hour and notify MD if needed

## 2013-01-09 NOTE — Progress Notes (Signed)
MD notified of patient being in L&D for procedure--orders to start IV

## 2013-01-09 NOTE — Progress Notes (Signed)
Discharge instructions given to pt--spanish interpretor at bedside--all questions answers pt indicates that she understands and has no further concerns--instructed to keep current appt with OBGYN on Monday

## 2013-01-09 NOTE — H&P (Signed)
  Kirsten Sanchez is an 37 y.o. [phone] [redacted]w[redacted]d female.   Chief Complaint: Transverse lie HPI:  Found to have trv lie yesterday for ECV.  History reviewed. No pertinent past medical history.  History reviewed. No pertinent past surgical history.  Family History  Problem Relation Age of Onset  . Diabetes Mother    Social History:  reports that she has never smoked. She has never used smokeless tobacco. She reports that she does not drink alcohol or use illicit drugs.  Allergies:  Allergies  Allergen Reactions  . Penicillins Nausea And Vomiting    Medications Prior to Admission  Medication Sig Dispense Refill  . docusate sodium (COLACE) 100 MG capsule Take 1 capsule (100 mg total) by mouth 2 (two) times daily as needed for constipation.  60 capsule  4  . glyBURIDE (DIABETA) 2.5 MG tablet Take 3 tablets (7.5 mg total) by mouth at bedtime.  60 tablet  1  . nystatin cream (MYCOSTATIN) Apply topically 2 (two) times daily.  30 g  0  . Prenatal Vit-Fe Fumarate-FA (PRENATAL VITAMINS) 28-0.8 MG TABS Take 1 tablet by mouth daily.  90 tablet  3     A comprehensive review of systems was negative.  Height 4' 11.5" (1.511 m), weight 180 lb (81.647 kg). Ht 4' 11.5" (1.511 m)  Wt 180 lb (81.647 kg)  BMI 35.76 kg/m2 General appearance: alert, cooperative and appears stated age Head: Normocephalic, without obvious abnormality, atraumatic Neck: supple, symmetrical, trachea midline Lungs: clear to auscultation bilaterally Heart: regular rate and rhythm, S1, S2 normal, no murmur, click, rub or gallop Abdomen: soft, non-tender; bowel sounds normal; no masses,  no organomegaly Extremities: extremities normal, atraumatic, no cyanosis or edema Skin: Skin color, texture, turgor normal. No rashes or lesions Neurologic: Grossly normal   Lab Results  Component Value Date   WBC 7.9 11/04/2012   HGB 11.7* 11/04/2012   HCT 35.5* 11/04/2012   MCV 93.7 11/04/2012   PLT 214 11/04/2012   Lab Results   Component Value Date   PREGTESTUR Positive 07/01/2012     Assessment/Plan Patient Active Problem List   Diagnosis Date Noted  . Language barrier, speaks Spanish 12/14/2012  . Gestational diabetes mellitus, antepartum 11/18/2012  . Advanced maternal age (AMA) in pregnancy 11/04/2012  . Supervision of high risk pregnancy in third trimester 07/29/2012   Transverse lie For ECV.  Eryanna Regal S 01/09/2013, 8:51 AM

## 2013-01-09 NOTE — Progress Notes (Signed)
Patient ID: Kirsten Sanchez, female   DOB: [birth year], 37 y.o.   MRN: [id number] After informed verbal and written consent, Terbutaline 0.25 mg SQ given, ECV was attempted under Ultrasound guidance.  Infant transverse, back down and head in RUQ. Backward role performed with one try and successful completion to vertex. FHR confirmed to be good after turn.Marland Kitchen   FHR was reactive before and after the procedure.   Pt. Tolerated the procedure well.

## 2013-01-11 ENCOUNTER — Ambulatory Visit (INDEPENDENT_AMBULATORY_CARE_PROVIDER_SITE_OTHER): Payer: Medicaid Other | Admitting: Obstetrics and Gynecology

## 2013-01-11 ENCOUNTER — Encounter: Payer: Self-pay | Admitting: Obstetrics and Gynecology

## 2013-01-11 VITALS — BP 112/62

## 2013-01-11 DIAGNOSIS — Z609 Problem related to social environment, unspecified: Secondary | ICD-10-CM

## 2013-01-11 DIAGNOSIS — O24419 Gestational diabetes mellitus in pregnancy, unspecified control: Secondary | ICD-10-CM

## 2013-01-11 DIAGNOSIS — Z789 Other specified health status: Secondary | ICD-10-CM

## 2013-01-11 DIAGNOSIS — O322XX1 Maternal care for transverse and oblique lie, fetus 1: Secondary | ICD-10-CM

## 2013-01-11 DIAGNOSIS — O99810 Abnormal glucose complicating pregnancy: Secondary | ICD-10-CM

## 2013-01-11 DIAGNOSIS — O322XX0 Maternal care for transverse and oblique lie, not applicable or unspecified: Secondary | ICD-10-CM

## 2013-01-11 DIAGNOSIS — O0993 Supervision of high risk pregnancy, unspecified, third trimester: Secondary | ICD-10-CM

## 2013-01-11 LAB — POCT URINALYSIS DIP (DEVICE)
Bilirubin Urine: NEGATIVE
Glucose, UA: NEGATIVE mg/dL
Ketones, ur: NEGATIVE mg/dL
Nitrite: NEGATIVE
Protein, ur: NEGATIVE mg/dL
Specific Gravity, Urine: 1.02 (ref 1.005–1.030)
Urobilinogen, UA: 0.2 mg/dL (ref 0.0–1.0)
pH: 7 (ref 5.0–8.0)

## 2013-01-11 NOTE — Progress Notes (Signed)
NST reviewed and reactive. Patient doing well without complaints. CBG fasting within range. 2hr pp highest 152, most within range. Patient admits to not always adhering to diet. Reviewed importance of good glycemic control in this peripartum period. Patient scheduled for IOL in am of 10/30

## 2013-01-11 NOTE — Progress Notes (Signed)
P = 61   Pt states she had successful version on 10/18 - reports some soreness of lower abdomen.  Decreased FM since version.  Fetal position remains vertex per [place] in clinic today.  [place] for growth scheduled on 10/23.

## 2013-01-11 NOTE — Progress Notes (Signed)
10/17 NST reviewed and reactive

## 2013-01-12 ENCOUNTER — Encounter (HOSPITAL_COMMUNITY): Payer: Self-pay | Admitting: *Deleted

## 2013-01-12 ENCOUNTER — Inpatient Hospital Stay (HOSPITAL_COMMUNITY)
Admission: AD | Admit: 2013-01-12 | Discharge: 2013-01-12 | Disposition: A | Discharge status: Home / Self Care | Payer: Medicaid Other | Source: Ambulatory Visit | Attending: Obstetrics & Gynecology | Admitting: Obstetrics & Gynecology

## 2013-01-12 DIAGNOSIS — B3731 Acute candidiasis of vulva and vagina: Secondary | ICD-10-CM | POA: Insufficient documentation

## 2013-01-12 DIAGNOSIS — O24919 Unspecified diabetes mellitus in pregnancy, unspecified trimester: Secondary | ICD-10-CM

## 2013-01-12 DIAGNOSIS — O24419 Gestational diabetes mellitus in pregnancy, unspecified control: Secondary | ICD-10-CM

## 2013-01-12 DIAGNOSIS — O99810 Abnormal glucose complicating pregnancy: Secondary | ICD-10-CM | POA: Insufficient documentation

## 2013-01-12 DIAGNOSIS — O2390 Unspecified genitourinary tract infection in pregnancy, unspecified trimester: Secondary | ICD-10-CM | POA: Insufficient documentation

## 2013-01-12 DIAGNOSIS — B373 Candidiasis of vulva and vagina: Secondary | ICD-10-CM | POA: Insufficient documentation

## 2013-01-12 DIAGNOSIS — O99891 Other specified diseases and conditions complicating pregnancy: Secondary | ICD-10-CM | POA: Insufficient documentation

## 2013-01-12 DIAGNOSIS — O479 False labor, unspecified: Secondary | ICD-10-CM | POA: Insufficient documentation

## 2013-01-12 HISTORY — DX: Gestational diabetes mellitus in pregnancy, unspecified control: O24.419

## 2013-01-12 LAB — WET PREP, GENITAL
Clue Cells Wet Prep HPF POC: NONE SEEN
Trich, Wet Prep: NONE SEEN

## 2013-01-12 MED ORDER — FLUCONAZOLE 150 MG PO TABS: 150.0000 mg | ORAL_TABLET | Freq: Once | ORAL | Status: DC

## 2013-01-12 NOTE — OB Triage Note (Signed)
Patient states she had gush of fluid at 11;30 this morning before getting into shower. No leaking at this time.

## 2013-01-12 NOTE — MAU Note (Signed)
Pt C/O ? Leaking of fluid, report given to [facility] RN in L&D, pt to be triaged in room 169.

## 2013-01-12 NOTE — Progress Notes (Addendum)
Patient ID: Kirsten Sanchez, female   DOB: 23-Jul-1975, 37 y.o.   MRN: [id number] Chief Complaint:  Rupture of Membranes   None     HPI: Kirsten Sanchez is a 37 y.o. G4P3003 at [redacted]w[redacted]d who presents to maternity admissions reporting watery vaginal discharge occurring once at 11:30 today. Contractions painful in lower abdomen and pelvis. Denies contractions, leakage of fluid or vaginal bleeding. Good fetal movement.   Pregnancy Course: HRC for A2 GDM. In fetal testing and has appointment for IOL at 39 wks.  Past Medical History: Past Medical History  Diagnosis Date  . Gestational diabetes     Past obstetric history: OB History  Gravida Para Term Preterm AB SAB TAB Ectopic Multiple Living  4 3 3  0 0 0 0 0 0 3    # Outcome Date GA Lbr Len/2nd Weight Sex Delivery Anes PTL Lv  4 CUR           3 TRM 07/10/07 [redacted]w[redacted]d  3.26 kg (7 lb 3 oz) F SVD None  Y  2 TRM 10/15/99 [redacted]w[redacted]d  3.317 kg (7 lb 5 oz) M SVD EPI  Y  1 TRM 03/21/97 [redacted]w[redacted]d  3.317 kg (7 lb 5 oz) M SVD EPI  Y      Past Surgical History: History reviewed. No pertinent past surgical history.   Family History: Family History  Problem Relation Age of Onset  . Diabetes Mother     Social History: History  Substance Use Topics  . Smoking status: Never Smoker   . Smokeless tobacco: Never Used  . Alcohol Use: No    Allergies:  Allergies  Allergen Reactions  . Penicillins Nausea And Vomiting    Meds:  Prescriptions prior to admission  Medication Sig Dispense Refill  . glyBURIDE (DIABETA) 2.5 MG tablet Take 3 tablets (7.5 mg total) by mouth at bedtime.  60 tablet  1  . nystatin cream (MYCOSTATIN) Apply topically 2 (two) times daily.  30 g  0  . Prenatal Vit-Fe Fumarate-FA (PRENATAL VITAMINS) 28-0.8 MG TABS Take 1 tablet by mouth daily.  90 tablet  3    ROS: Pertinent findings in history of present illness.  Physical Exam  Blood pressure 106/56, pulse 60, temperature 98.2 F (36.8 C), temperature source Oral, resp.  rate 18. GENERAL: Well-developed, well-nourished female in no acute distress.  HEENT: normocephalic HEART: normal rate RESP: normal effort ABDOMEN: Soft, non-tender, gravid appropriate for gestational age EXTREMITIES: Nontender, no edema NEURO: alert and oriented SPECULUM EXAM: NEFG, copious yeast-like discharge, no blood, cervix clean Dilation: 2.5 Effacement (%): 50 Cervical Position: Posterior Station: -3 Presentation: Vertex Exam by:: Inetta Fermo, CNM student No change on repeat cx exam after observation for labor  FHT:  Baseline 145 , moderate variability, accelerations present, no decelerations Contractions: irregular   Labs: Results for orders placed during the hospital encounter of 01/12/13 (from the past 24 hour(s))  AMNISURE RUPTURE OF MEMBRANE (ROM)     Status: None   Collection Time    01/12/13  2:20 PM      Result Value Range   Amnisure ROM NEGATIVE    WET PREP, GENITAL     Status: Abnormal   Collection Time    01/12/13  2:30 PM      Result Value Range   Yeast Wet Prep HPF POC MODERATE (*) NONE SEEN   Trich, Wet Prep NONE SEEN  NONE SEEN   Clue Cells Wet Prep HPF POC NONE SEEN  NONE SEEN   WBC,  Wet Prep HPF POC MANY (*) NONE SEEN    Imaging:  No results found. MAU Course:   Assessment: 1. Gestational diabetes mellitus, antepartum   37 yo G4P3003 at [redacted]w[redacted]d east vaginitis False labor  Plan: Discharge home Labor precautions and fetal kick counts Follow-up Information   Follow up with [facility] In 2 days.   Specialty:  Obstetrics and Gynecology   Contact information:   [address] [place] [address] [phone]       Medication List         fluconazole 150 MG tablet  Commonly known as:  DIFLUCAN  Take 1 tablet (150 mg total) by mouth once.     glyBURIDE 2.5 MG tablet  Commonly known as:  DIABETA  Take 3 tablets (7.5 mg total) by mouth at bedtime.     nystatin cream  Commonly known as:  MYCOSTATIN  Apply topically 2  (two) times daily.     Prenatal Vitamins 28-0.8 MG Tabs  Take 1 tablet by mouth daily.         Kirsten Sanchez, CNM 01/12/2013 3:44 PM

## 2013-01-12 NOTE — MAU Provider Note (Signed)
Attestation of Attending Supervision of Fellow: Evaluation and management procedures were performed by the Fellow under my supervision and collaboration.  I have reviewed the Fellow's note and chart, and I agree with the management and plan.    

## 2013-01-13 ENCOUNTER — Telehealth (HOSPITAL_COMMUNITY): Payer: Self-pay | Admitting: *Deleted

## 2013-01-13 ENCOUNTER — Encounter: Payer: Self-pay | Admitting: *Deleted

## 2013-01-13 NOTE — Telephone Encounter (Signed)
Preadmission screen  

## 2013-01-13 NOTE — Telephone Encounter (Signed)
[id number] interpreter number

## 2013-01-14 ENCOUNTER — Other Ambulatory Visit: Payer: Self-pay | Admitting: Obstetrics & Gynecology

## 2013-01-14 ENCOUNTER — Ambulatory Visit (HOSPITAL_COMMUNITY)
Admission: RE | Admit: 2013-01-14 | Discharge: 2013-01-14 | Disposition: A | Discharge status: Home / Self Care | Payer: Medicaid Other | Source: Ambulatory Visit | Attending: Obstetrics & Gynecology | Admitting: Obstetrics & Gynecology

## 2013-01-14 ENCOUNTER — Ambulatory Visit (HOSPITAL_COMMUNITY): Admission: RE | Admit: 2013-01-14 | Payer: Self-pay | Source: Ambulatory Visit

## 2013-01-14 ENCOUNTER — Ambulatory Visit (INDEPENDENT_AMBULATORY_CARE_PROVIDER_SITE_OTHER): Payer: Medicaid Other | Admitting: *Deleted

## 2013-01-14 VITALS — BP 110/61

## 2013-01-14 DIAGNOSIS — O99810 Abnormal glucose complicating pregnancy: Secondary | ICD-10-CM

## 2013-01-14 DIAGNOSIS — O3660X0 Maternal care for excessive fetal growth, unspecified trimester, not applicable or unspecified: Secondary | ICD-10-CM | POA: Insufficient documentation

## 2013-01-14 DIAGNOSIS — O09529 Supervision of elderly multigravida, unspecified trimester: Secondary | ICD-10-CM | POA: Insufficient documentation

## 2013-01-14 DIAGNOSIS — O24419 Gestational diabetes mellitus in pregnancy, unspecified control: Secondary | ICD-10-CM

## 2013-01-14 NOTE — Progress Notes (Signed)
P=63 Painpressure/irregular contractions during activity.

## 2013-01-18 ENCOUNTER — Encounter: Payer: Self-pay | Admitting: Obstetrics and Gynecology

## 2013-01-18 ENCOUNTER — Ambulatory Visit (INDEPENDENT_AMBULATORY_CARE_PROVIDER_SITE_OTHER): Payer: Medicaid Other | Admitting: Obstetrics and Gynecology

## 2013-01-18 DIAGNOSIS — O09529 Supervision of elderly multigravida, unspecified trimester: Secondary | ICD-10-CM

## 2013-01-18 DIAGNOSIS — O24419 Gestational diabetes mellitus in pregnancy, unspecified control: Secondary | ICD-10-CM

## 2013-01-18 DIAGNOSIS — Z789 Other specified health status: Secondary | ICD-10-CM

## 2013-01-18 DIAGNOSIS — Z609 Problem related to social environment, unspecified: Secondary | ICD-10-CM

## 2013-01-18 DIAGNOSIS — O0993 Supervision of high risk pregnancy, unspecified, third trimester: Secondary | ICD-10-CM

## 2013-01-18 DIAGNOSIS — O99810 Abnormal glucose complicating pregnancy: Secondary | ICD-10-CM

## 2013-01-18 LAB — POCT URINALYSIS DIP (DEVICE)
Bilirubin Urine: NEGATIVE
Glucose, UA: NEGATIVE mg/dL
Nitrite: NEGATIVE

## 2013-01-18 NOTE — Progress Notes (Signed)
P= 59 C/o of intermittent lower abdominal/pelvic pain especially with activity and goes away with rest.

## 2013-01-18 NOTE — Progress Notes (Signed)
[place] for growth done 10/24.  IOL scheduled 10/30 @ 0730

## 2013-01-18 NOTE — Progress Notes (Signed)
10/23 EFW 3176gm (63%tile) NST reviewed and reactive. Patient doing well without complaints. FM/labor precautions reviewed. Patient scheduled for IOL on 10/30

## 2013-01-18 NOTE — Progress Notes (Signed)
BS all within range

## 2013-01-21 ENCOUNTER — Encounter (HOSPITAL_COMMUNITY): Payer: Self-pay

## 2013-01-21 ENCOUNTER — Inpatient Hospital Stay (HOSPITAL_COMMUNITY)
Admission: RE | Admit: 2013-01-21 | Discharge: 2013-01-23 | DRG: 775 | Disposition: A | Discharge status: Home / Self Care | Payer: Medicaid Other | Source: Ambulatory Visit | Attending: Obstetrics & Gynecology | Admitting: Obstetrics & Gynecology

## 2013-01-21 ENCOUNTER — Inpatient Hospital Stay (HOSPITAL_COMMUNITY): Payer: Medicaid Other | Admitting: Anesthesiology

## 2013-01-21 ENCOUNTER — Encounter (HOSPITAL_COMMUNITY): Payer: Medicaid Other | Admitting: Anesthesiology

## 2013-01-21 VITALS — BP 94/61 | HR 68 | Temp 97.4°F | Resp 18 | Ht 59.0 in | Wt 177.0 lb

## 2013-01-21 DIAGNOSIS — O99814 Abnormal glucose complicating childbirth: Principal | ICD-10-CM | POA: Diagnosis present

## 2013-01-21 DIAGNOSIS — O24419 Gestational diabetes mellitus in pregnancy, unspecified control: Secondary | ICD-10-CM

## 2013-01-21 DIAGNOSIS — O322XX1 Maternal care for transverse and oblique lie, fetus 1: Secondary | ICD-10-CM

## 2013-01-21 DIAGNOSIS — O09529 Supervision of elderly multigravida, unspecified trimester: Secondary | ICD-10-CM | POA: Diagnosis present

## 2013-01-21 LAB — CBC
HCT: 36.2 % (ref 36.0–46.0)
Hemoglobin: 12.5 g/dL (ref 12.0–15.0)
MCH: 30 pg (ref 26.0–34.0)
MCHC: 34.5 g/dL (ref 30.0–36.0)
RBC: 4.16 MIL/uL (ref 3.87–5.11)
RDW: 14.4 % (ref 11.5–15.5)

## 2013-01-21 LAB — ABO/RH: ABO/RH(D): O POS

## 2013-01-21 LAB — GLUCOSE, CAPILLARY
Glucose-Capillary: 72 mg/dL (ref 70–99)
Glucose-Capillary: 71 mg/dL (ref 70–99)
Glucose-Capillary: 89 mg/dL (ref 70–99)

## 2013-01-21 LAB — TYPE AND SCREEN: Antibody Screen: NEGATIVE

## 2013-01-21 LAB — GLUCOSE, RANDOM: Glucose, Bld: 135 mg/dL — ABNORMAL HIGH (ref 70–99)

## 2013-01-21 MED ORDER — PHENYLEPHRINE 40 MCG/ML (10ML) SYRINGE FOR IV PUSH (FOR BLOOD PRESSURE SUPPORT): 80.0000 ug | PREFILLED_SYRINGE | INTRAVENOUS | Status: DC | PRN

## 2013-01-21 MED ORDER — IBUPROFEN 600 MG PO TABS: 600.0000 mg | ORAL_TABLET | Freq: Four times a day (QID) | ORAL | Status: DC | PRN

## 2013-01-21 MED ORDER — DIPHENHYDRAMINE HCL 50 MG/ML IJ SOLN: 12.5000 mg | INTRAMUSCULAR | Status: DC | PRN

## 2013-01-21 MED ORDER — ONDANSETRON HCL 4 MG/2ML IJ SOLN: 4.0000 mg | Freq: Four times a day (QID) | INTRAMUSCULAR | Status: DC | PRN

## 2013-01-21 MED ORDER — LIDOCAINE HCL (PF) 1 % IJ SOLN
INTRAMUSCULAR | Status: DC | PRN
  Administered 2013-01-21 (×2): 5 mL

## 2013-01-21 MED ORDER — LIDOCAINE HCL (PF) 1 % IJ SOLN: 30.0000 mL | INTRAMUSCULAR | Status: DC | PRN

## 2013-01-21 MED ORDER — ACETAMINOPHEN 325 MG PO TABS: 650.0000 mg | ORAL_TABLET | ORAL | Status: DC | PRN

## 2013-01-21 MED ORDER — OXYTOCIN BOLUS FROM INFUSION: 500.0000 mL | INTRAVENOUS | Status: DC

## 2013-01-21 MED ORDER — OXYTOCIN 40 UNITS IN LACTATED RINGERS INFUSION - SIMPLE MED
62.5000 mL/h | INTRAVENOUS | Status: DC
  Administered 2013-01-22: 999 mL/h via INTRAVENOUS
  Filled 2013-01-21: qty 1000

## 2013-01-21 MED ORDER — OXYCODONE-ACETAMINOPHEN 5-325 MG PO TABS: 1.0000 | ORAL_TABLET | ORAL | Status: DC | PRN

## 2013-01-21 MED ORDER — LACTATED RINGERS IV SOLN
500.0000 mL | Freq: Once | INTRAVENOUS | Status: AC
  Administered 2013-01-21: 500 mL via INTRAVENOUS

## 2013-01-21 MED ORDER — PHENYLEPHRINE 40 MCG/ML (10ML) SYRINGE FOR IV PUSH (FOR BLOOD PRESSURE SUPPORT)
80.0000 ug | PREFILLED_SYRINGE | INTRAVENOUS | Status: DC | PRN
  Filled 2013-01-21: qty 10

## 2013-01-21 MED ORDER — TERBUTALINE SULFATE 1 MG/ML IJ SOLN: 0.2500 mg | Freq: Once | INTRAMUSCULAR | Status: AC | PRN

## 2013-01-21 MED ORDER — CITRIC ACID-SODIUM CITRATE 334-500 MG/5ML PO SOLN: 30.0000 mL | ORAL | Status: DC | PRN

## 2013-01-21 MED ORDER — EPHEDRINE 5 MG/ML INJ: 10.0000 mg | INTRAVENOUS | Status: DC | PRN

## 2013-01-21 MED ORDER — FENTANYL 2.5 MCG/ML BUPIVACAINE 1/10 % EPIDURAL INFUSION (WH - ANES)
14.0000 mL/h | INTRAMUSCULAR | Status: DC | PRN
  Administered 2013-01-21 – 2013-01-22 (×2): 14 mL/h via EPIDURAL
  Filled 2013-01-21 (×3): qty 125

## 2013-01-21 MED ORDER — EPHEDRINE 5 MG/ML INJ
10.0000 mg | INTRAVENOUS | Status: DC | PRN
  Filled 2013-01-21: qty 4

## 2013-01-21 MED ORDER — MISOPROSTOL 25 MCG QUARTER TABLET
25.0000 ug | ORAL_TABLET | ORAL | Status: DC | PRN
  Administered 2013-01-21: 25 ug via VAGINAL
  Filled 2013-01-21: qty 0.25

## 2013-01-21 MED ORDER — OXYTOCIN 40 UNITS IN LACTATED RINGERS INFUSION - SIMPLE MED
1.0000 m[IU]/min | INTRAVENOUS | Status: DC
  Administered 2013-01-21: 2 m[IU]/min via INTRAVENOUS

## 2013-01-21 MED ORDER — LACTATED RINGERS IV SOLN
INTRAVENOUS | Status: DC
  Administered 2013-01-21 – 2013-01-22 (×4): via INTRAVENOUS

## 2013-01-21 MED ORDER — LACTATED RINGERS IV SOLN: 500.0000 mL | INTRAVENOUS | Status: DC | PRN

## 2013-01-21 NOTE — Progress Notes (Signed)
Late decels noted on FHR tracing, Pitocin previously decreased.  IVF bolus given

## 2013-01-21 NOTE — H&P (Signed)
Kirsten Sanchez is a 37 y.o. female presenting for IOL @[redacted]w[redacted]d  2/2 ClassA2DM. Pt well controlled on glyburide. Did not have DM with prior 3 gestations. Does not have hx outside of pregnancy. No complaints at this time. No vag bleed, no LOF, good fetal movement. Has had intermittent abdominal crampy pain since she was last seen on the 18th here. Otherwise no consistent contraction pattern.   PNC at [facility]. Had abnormal quad screen (1:214 for downs). Nml anatomic [place], no markers of aneuploidy. Post glucose screen. GBS neg. Had successful version on 10/18 for transverse fetal lie. Was dilated to 2.5/50/-3 on 10/21 at f/up visit. Had yeast infection at that time, treated.   *history provided through interpreter  Maternal Medical History:  Reason for admission: Nausea.    OB History   Grav Para Term Preterm Abortions TAB SAB Ect Mult Living   4 3 3  0 0 0 0 0 0 3     1. Term, 38w0, 7lb5oz, M, SVD 2. Term, 40w0, 7lbs5oz, M, SVD 3. Term, 40w0, 7lbs3oz, F, SVD 4. Current  Past Medical History  Diagnosis Date  . Gestational diabetes    History reviewed. No pertinent past surgical history. Family History: family history includes Diabetes in her mother. Social History:  reports that she has never smoked. She has never used smokeless tobacco. She reports that she does not drink alcohol or use illicit drugs.   Prenatal Transfer Tool  Maternal Diabetes: Yes:  Diabetes Type:  Insulin/Medication controlled- glyburide Genetic Screening: Abnormal:  Results: Other:quad: 1:214 down's risk Maternal Ultrasounds/Referrals: Normal Fetal Ultrasounds or other Referrals:  None Maternal Substance Abuse:  No Significant Maternal Medications:  Meds include: Other: glyberide Significant Maternal Lab Results:  Lab values include: Group B Strep negative Other Comments:  None  Review of Systems  Constitutional: Negative for fever.  HENT: Negative for congestion and sore throat.   Eyes: Negative for blurred  vision and double vision.  Respiratory: Negative for cough and shortness of breath.   Cardiovascular: Negative for chest pain, palpitations and leg swelling.  Gastrointestinal: Positive for abdominal pain. Negative for heartburn, nausea and vomiting.  Genitourinary: Negative for dysuria, urgency and frequency.  Musculoskeletal: Negative for myalgias.  Neurological: Negative for dizziness, seizures, loss of consciousness, weakness and headaches.  Endo/Heme/Allergies: Does not bruise/bleed easily.  Psychiatric/Behavioral: Negative for depression.      Blood pressure 114/63, pulse 54, temperature 99.2 F (37.3 C), temperature source Oral, resp. rate 20, height 4\' 11"  (1.499 m), weight 80.287 kg (177 lb). Exam Physical Exam  Constitutional: She is oriented to person, place, and time. She appears well-developed and well-nourished.  HENT:  Head: Normocephalic and atraumatic.  Eyes: EOM are normal.  Neck: Neck supple.  Cardiovascular: Normal rate, regular rhythm and normal heart sounds.   No murmur heard. Respiratory: Effort normal and breath sounds normal. No respiratory distress. She has no wheezes.  GI: Soft. Bowel sounds are normal. There is no tenderness. There is no rebound.  Gravid   Musculoskeletal: Normal range of motion. She exhibits no edema.  Neurological: She is alert and oriented to person, place, and time. She has normal reflexes. No cranial nerve deficit. She exhibits normal muscle tone.  Skin: Skin is warm and dry. No erythema.  Psychiatric: She has a normal mood and affect. Her behavior is normal.     Dilation: 2.5 Effacement (%): 50 Station: -3 Presentation: Vertex (confirmed by bedside U/S) Exam by::  (Cytotec placed by Scheryl Marten RN)  Prenatal labs: ABO,  Rh: O/POS/-- (04/14 1531) Antibody: NEG (04/14 1531) Rubella: 7.47 (04/14 1531) RPR: NON REAC (08/13 1011)  HBsAg: NEGATIVE (04/14 1531)  HIV: NON REACTIVE (08/13 1011)  GBS: Negative (10/13 0000)    FHR- baseline 135, mod var, post accel, questionable late at 10:40?  UC- every 3-54mins  Assessment/Plan: Pt is a 37 y.o G4P3003 who presents at 39w0 for IOL 2/2 gDM (a2)  1. IOL- pt with unchanged cervix since exam on 10/21. Vertex presentation confirmed via bedside US given recent version. S/p Cytotec placement  -routine orders -HIV NR, GBS neg -pain control with epidural when in active labor, fentanyl prn -expect NSVD  2. A2gDM- no hx outside of pregnancy or with previous gestations. Well controlled on glyburide. Serum glucose here 135. Est fetal weight 7lbs at 38weeks.  -CBG q4 and q2 in active labor -no indication for treatment at this time  3. FHR- reassuring -cat II tracing  Anselm Lis 01/21/2013, 8:44 AM  I have seen and examined this patient and agree with above documentation in the resident's note. Pt IOL @ 39w)d for A2DM and well controlled. Check glucose q2 hour given elevated fasting initially. FWB- cat I tracing. cytotec placed. Will plan for pit afterwards.   Rulon Abide, M.D. [facility] Fellow 01/21/2013 11:14 AM

## 2013-01-21 NOTE — Anesthesia Preprocedure Evaluation (Signed)
Anesthesia Evaluation  Patient identified by MRN, date of birth, ID band Patient awake    Reviewed: Allergy & Precautions, H&P , Patient's Chart, lab work & pertinent test results  Airway Mallampati: III TM Distance: >3 FB Neck ROM: full    Dental no notable dental hx.    Pulmonary neg pulmonary ROS,  breath sounds clear to auscultation  Pulmonary exam normal       Cardiovascular negative cardio ROS  Rhythm:regular Rate:Normal     Neuro/Psych negative neurological ROS  negative psych ROS   GI/Hepatic negative GI ROS, Neg liver ROS,   Endo/Other  negative endocrine ROSdiabetesMorbid obesity  Renal/GU negative Renal ROS     Musculoskeletal   Abdominal   Peds  Hematology negative hematology ROS (+)   Anesthesia Other Findings   Reproductive/Obstetrics (+) Pregnancy                           Anesthesia Physical Anesthesia Plan  ASA: III  Anesthesia Plan: Epidural   Post-op Pain Management:    Induction:   Airway Management Planned:   Additional Equipment:   Intra-op Plan:   Post-operative Plan:   Informed Consent: I have reviewed the patients History and Physical, chart, labs and discussed the procedure including the risks, benefits and alternatives for the proposed anesthesia with the patient or authorized representative who has indicated his/her understanding and acceptance.     Plan Discussed with:   Anesthesia Plan Comments:         Anesthesia Quick Evaluation

## 2013-01-21 NOTE — Progress Notes (Signed)
Kirsten Sanchez is a 36 y.o. G4P3003 at [redacted]w[redacted]d  admitted for induction of labor due to A2DM.  Subjective:  Doing well. Hurting with contractions off cytotec alone. Sugars have been well controlled (last 79)  Objective: BP 113/62  Pulse 56  Temp(Src) 98.6 F (37 C) (Oral)  Resp 20  Ht 4\' 11"  (1.499 m)  Wt 80.287 kg (177 lb)  BMI 35.73 kg/m2      FHT:  FHR: 140 bpm, variability: moderate,  accelerations:  Present,  decelerations:  Absent UC:   regular, every 1.5-3 minutes SVE:   Dilation: 3 Effacement (%): 50 Station: -3 Exam by:: Dr. Reola Calkins  Labs: Lab Results  Component Value Date   WBC 7.9 01/21/2013   HGB 12.5 01/21/2013   HCT 36.2 01/21/2013   MCV 87.0 01/21/2013   PLT 174 01/21/2013    Assessment / Plan: IOL for A2DM progressing on cytotec x 1.   Labor: now spontaneously contracting, will monitor and if no change in 2 hours or spaced out contractions, plan to start pit Fetal Wellbeing:  Category I Pain Control:  Labor support without medications I/D:  n/a Anticipated MOD:  NSVD  Kirsten Sanchez L 01/21/2013, 3:03 PM

## 2013-01-21 NOTE — Progress Notes (Signed)
Alex, spanish interpretor, at bedside for admission

## 2013-01-21 NOTE — Anesthesia Procedure Notes (Signed)
Epidural Patient location during procedure: OB Start time: 01/21/2013 4:50 PM  Staffing Anesthesiologist: Brayton Caves Performed by: anesthesiologist   Preanesthetic Checklist Completed: patient identified, site marked, surgical consent, pre-op evaluation, timeout performed, IV checked, risks and benefits discussed and monitors and equipment checked  Epidural Patient position: sitting Prep: site prepped and draped and DuraPrep Patient monitoring: continuous pulse ox and blood pressure Approach: midline Injection technique: LOR air  Needle:  Needle type: Tuohy  Needle gauge: 17 G Needle length: 9 cm and 9 Needle insertion depth: 5 cm cm Catheter type: closed end flexible Catheter size: 19 Gauge Catheter at skin depth: 10 cm Test dose: negative  Assessment Events: blood not aspirated, injection not painful, no injection resistance, negative IV test and no paresthesia  Additional Notes Patient identified.  Risk benefits discussed including failed block, incomplete pain control, headache, nerve damage, paralysis, blood pressure changes, nausea, vomiting, reactions to medication both toxic or allergic, and postpartum back pain.  Patient expressed understanding and wished to proceed.  All questions were answered.  Sterile technique used throughout procedure and epidural site dressed with sterile barrier dressing. No paresthesia or other complications noted.The patient did not experience any signs of intravascular injection such as tinnitus or metallic taste in mouth nor signs of intrathecal spread such as rapid motor block. Please see nursing notes for vital signs.

## 2013-01-21 NOTE — H&P (Signed)
Attestation of Attending Supervision of Obstetric Fellow: Evaluation and management procedures were performed by the Obstetric Fellow under my supervision and collaboration.  I have reviewed the Obstetric Fellow's note and chart, and I agree with the management and plan.  Juliyah Mergen, MD, FACOG Attending Obstetrician & Gynecologist Faculty Practice, [facility]   

## 2013-01-21 NOTE — Progress Notes (Signed)
Dr. Martin Sink requesting Dr. Reola Calkins view FHR tracing secondary late decels.  Dr.'s Reola Calkins & Cowart @ nurse's station reviewing FHR tracing.  Dr. Reola Calkins agreeable with Dr. Tommi Emery POC.

## 2013-01-21 NOTE — Progress Notes (Signed)
Dr. [place] Sink asked to review FHR tracing secondary late decels.  MD informed Pitocin decreased from 4mu to 2mu secondary tachysystole & pt given IVF bolus. Nurse asked if pitocin should be turned off, MD states leave @ 2mu.

## 2013-01-21 NOTE — Progress Notes (Signed)
Kirsten Sanchez is a 37 y.o. G4P3003 at [redacted]w[redacted]d  admitted for induction of labor due to A2DM.  Subjective: Resting comfortably in bed, no complaints at this time, making minimal cervical change  Objective: BP 111/64  Pulse 52  Temp(Src) 98.4 F (36.9 C) (Oral)  Resp 20  Ht 4\' 11"  (1.499 m)  Wt 80.287 kg (177 lb)  BMI 35.73 kg/m2  SpO2 100%      FHT:  FHR: 130 bpm, variability: moderate,  accelerations:  Present,  decelerations:  Absent UC:   regular, every 2-3 SVE:   Dilation: 4 Effacement (%): 60 Station: -2 Exam by:: [employer] RN  Labs: Lab Results  Component Value Date   WBC 7.9 01/21/2013   HGB 12.5 01/21/2013   HCT 36.2 01/21/2013   MCV 87.0 01/21/2013   PLT 174 01/21/2013    Assessment / Plan: IOL for A2DM slowed with progression  Labor: still in early stages of labor, plan to augment with pit 2X2 Fetal Wellbeing:  Category I Pain Control:  Labor support without medications I/D:  n/a Anticipated MOD:  NSVD  Anselm Lis 01/21/2013, 7:05 PM

## 2013-01-21 NOTE — Progress Notes (Signed)
LABOR PROGRESS NOTE  RINOA GARRAMONE is a 37 y.o. [phone] at [redacted]w[redacted]d  admitted for induction of labor due to A2DM.  Subjective: Comfortable with epidural. Complaining of feeling "very wet" between her legs. Pitocin was up to 4 mu/min at 19:30, turned down to 2 mu/min due to tachysystole. Pitocin turned off ~21:45 due to repetitive late decels. Pt has continued contracting well on her own so Pitocin left off.   Objective: BP 111/66  Pulse 52  Temp(Src) 98 F (36.7 C) (Oral)  Resp 18  Ht 4\' 11"  (1.499 m)  Wt 80.287 kg (177 lb)  BMI 35.73 kg/m2  SpO2 100% Total I/O In: -  Out: 600 [Urine:600]  FHT:  FHR: 145 bpm, variability: minimal ,  accelerations:  Abscent,  decelerations:  Present + variable decels, some late in timing. + occasional late decels UC:   regular, every 3 minutes Pelvic: Large bloody show.  SVE:   Dilation: 7.5 Effacement (%): 100 Station: -2 Exam by:: Dr. [place] Sink Pitocin off  Labs: Lab Results  Component Value Date   WBC 7.9 01/21/2013   HGB 12.5 01/21/2013   HCT 36.2 01/21/2013   MCV 87.0 01/21/2013   PLT 174 01/21/2013    Assessment / Plan: IOL for  A2DM  Labor: Progressing well. Pitocin has been off for ~2 hours however she is still contracting well and still making cervical change. Will leave off unless cervical change stalls or contractions space out.  Fetal Wellbeing:  Category II Pain Control:  Epidural Anticipated MOD:  NSVD  Cowart, Ryann, MD 01/21/2013, 11:24 PM  I have seen and examined this patient and agree with above documentation in the resident's note. One late decel, otherwise variables with excellent variability in between. Rapid cervical change likely in part a source. Cont to monitor.   Rulon Abide, M.D. [facility] Fellow 01/22/2013 12:49 AM

## 2013-01-22 ENCOUNTER — Encounter (HOSPITAL_COMMUNITY): Payer: Self-pay

## 2013-01-22 DIAGNOSIS — O09529 Supervision of elderly multigravida, unspecified trimester: Secondary | ICD-10-CM

## 2013-01-22 DIAGNOSIS — O99814 Abnormal glucose complicating childbirth: Secondary | ICD-10-CM

## 2013-01-22 LAB — GLUCOSE, CAPILLARY
Glucose-Capillary: 101 mg/dL — ABNORMAL HIGH (ref 70–99)
Glucose-Capillary: 116 mg/dL — ABNORMAL HIGH (ref 70–99)

## 2013-01-22 MED ORDER — TETANUS-DIPHTH-ACELL PERTUSSIS 5-2.5-18.5 LF-MCG/0.5 IM SUSP: 0.5000 mL | Freq: Once | INTRAMUSCULAR | Status: DC

## 2013-01-22 MED ORDER — OXYTOCIN 40 UNITS IN LACTATED RINGERS INFUSION - SIMPLE MED: 1.0000 m[IU]/min | INTRAVENOUS | Status: DC

## 2013-01-22 MED ORDER — ONDANSETRON HCL 4 MG/2ML IJ SOLN: 4.0000 mg | INTRAMUSCULAR | Status: DC | PRN

## 2013-01-22 MED ORDER — ZOLPIDEM TARTRATE 5 MG PO TABS: 5.0000 mg | ORAL_TABLET | Freq: Every evening | ORAL | Status: DC | PRN

## 2013-01-22 MED ORDER — WITCH HAZEL-GLYCERIN EX PADS: 1.0000 "application " | MEDICATED_PAD | CUTANEOUS | Status: DC | PRN

## 2013-01-22 MED ORDER — OXYCODONE-ACETAMINOPHEN 5-325 MG PO TABS: 1.0000 | ORAL_TABLET | ORAL | Status: DC | PRN

## 2013-01-22 MED ORDER — ONDANSETRON HCL 4 MG PO TABS: 4.0000 mg | ORAL_TABLET | ORAL | Status: DC | PRN

## 2013-01-22 MED ORDER — LACTATED RINGERS IV SOLN
INTRAVENOUS | Status: DC
  Administered 2013-01-22: 02:00:00 via INTRAUTERINE

## 2013-01-22 MED ORDER — DIPHENHYDRAMINE HCL 25 MG PO CAPS: 25.0000 mg | ORAL_CAPSULE | Freq: Four times a day (QID) | ORAL | Status: DC | PRN

## 2013-01-22 MED ORDER — BENZOCAINE-MENTHOL 20-0.5 % EX AERO
1.0000 "application " | INHALATION_SPRAY | CUTANEOUS | Status: DC | PRN
  Filled 2013-01-22 (×2): qty 56

## 2013-01-22 MED ORDER — DIBUCAINE 1 % RE OINT
1.0000 "application " | TOPICAL_OINTMENT | RECTAL | Status: DC | PRN
  Filled 2013-01-22: qty 28

## 2013-01-22 MED ORDER — IBUPROFEN 600 MG PO TABS
600.0000 mg | ORAL_TABLET | Freq: Four times a day (QID) | ORAL | Status: DC
  Administered 2013-01-22 – 2013-01-23 (×5): 600 mg via ORAL
  Filled 2013-01-22 (×6): qty 1

## 2013-01-22 MED ORDER — LANOLIN HYDROUS EX OINT: TOPICAL_OINTMENT | CUTANEOUS | Status: DC | PRN

## 2013-01-22 MED ORDER — PRENATAL MULTIVITAMIN CH
1.0000 | ORAL_TABLET | Freq: Every day | ORAL | Status: DC
  Administered 2013-01-22 – 2013-01-23 (×2): 1 via ORAL
  Filled 2013-01-22 (×2): qty 1

## 2013-01-22 MED ORDER — LACTATED RINGERS IV SOLN
INTRAVENOUS | Status: DC
Start: 2013-01-22 — End: 2013-01-22
  Administered 2013-01-22 (×2): via INTRAUTERINE

## 2013-01-22 MED ORDER — SIMETHICONE 80 MG PO CHEW: 80.0000 mg | CHEWABLE_TABLET | ORAL | Status: DC | PRN

## 2013-01-22 MED ORDER — SENNOSIDES-DOCUSATE SODIUM 8.6-50 MG PO TABS
2.0000 | ORAL_TABLET | ORAL | Status: DC
  Administered 2013-01-23: 2 via ORAL
  Filled 2013-01-22 (×2): qty 2

## 2013-01-22 NOTE — Anesthesia Postprocedure Evaluation (Signed)
  Anesthesia Post-op Note  Patient: Kirsten Sanchez  Procedure(s) Performed: * No procedures listed *  Patient Location: Mother/Baby  Anesthesia Type:Epidural  Level of Consciousness: awake, alert  and oriented  Airway and Oxygen Therapy: Patient Spontanous Breathing  Post-op Pain: none  Post-op Assessment: Post-op Vital signs reviewed and Patient's Cardiovascular Status Stable  Post-op Vital Signs: Reviewed and stable  Complications: No apparent anesthesia complications

## 2013-01-22 NOTE — Progress Notes (Signed)
Dr.'s Reola Calkins & Cowart @ bedside along with interpreter.  Dr. Reola Calkins performing VE attempting to rotate fetal head.  MS states fetal head asynclitic, prolonging pt's labor.  Information translated to pt & spouse with understanding verbalized

## 2013-01-22 NOTE — Progress Notes (Signed)
LABOR PROGRESS NOTE  Kirsten Sanchez is a 37 y.o. G4P3003 at [redacted]w[redacted]d  admitted for IOL due to A2DM.  Subjective: Pt denies any complaints. No pressure. Pain well controlled with epidural. Pitocin has remained at 3 mu/min due to repetitive variables. S/p another 200 cc amnioinfusion with temporary improvement in tracing.   Objective: BP 113/68  Pulse 67  Temp(Src) 97.7 F (36.5 C) (Oral)  Resp 20  Ht 4\' 11"  (1.499 m)  Wt 80.287 kg (177 lb)  BMI 35.73 kg/m2  SpO2 100% Total I/O In: -  Out: 600 [Urine:600]  FHT:  FHR: 145 bpm, variability: moderate with some periods of minimal,  accelerations: absent,  decelerations:  Present Repetitive variables, some deep with nadir to 70. Occasional late decel.  UC:   q2 mins SVE:   Dilation: 8.5 Effacement (%): 100 Station: 0 Exam by:: Dr. [place] Sink Pitocin @ 3 mu/min  Labs: Lab Results  Component Value Date   WBC 7.9 01/21/2013   HGB 12.5 01/21/2013   HCT 36.2 01/21/2013   MCV 87.0 01/21/2013   PLT 174 01/21/2013    Assessment / Plan: IOL due to A2DM, progressing slowly  Labor: Contractions remain predominantly inadequate. Have been unable to increase Pit due to FHR. Cervix with minimal change since last check. Will discuss plan of care with Dr. Reola Calkins. Fetal Wellbeing:  Category II Pain Control:  Epidural Anticipated MOD:  TBD  Cowart, Ryann, MD 01/22/2013, 4:38 AM   I have seen and examined this patient and agree with above documentation in the resident's note. Fetal HR with deep variables and slow return to baseline around the time of my exam. Extremely slowly making cervical change but with highly inadequate contractions and not following a typical labor curve. Pit turned off due to depth of repetitive variables. FHR improved afterward. Will cont to monitor off pit for now and attempt to restart in 1 hour. If unable to make cervical change due to fetal intolerance of labor, may need to discuss c-section in the future.     Rulon Abide, M.D. [facility] Fellow 01/22/2013 5:10 AM

## 2013-01-22 NOTE — Progress Notes (Signed)
FHR tracing reviewed @ nurse's station by Dr. [place] Sink

## 2013-01-22 NOTE — Progress Notes (Signed)
Dr. Reola Calkins into see pt.  MD states she's been reviewing FHR tracing.  MD informed no cervical change noted with recent VE.  POC includes restarting Pitocin @ 0715

## 2013-01-22 NOTE — Progress Notes (Signed)
Kirsten Sanchez is a 37 y.o. [phone] at [redacted]w[redacted]d admitted for induction of labor due to Gestational diabetes.  Subjective:  Pt now s/p epidural and more comfortable. +FM  Objective: BP 110/68  Pulse 55  Temp(Src) 98.3 F (36.8 C) (Oral)  Resp 20  Ht 4\' 11"  (1.499 m)  Wt 80.287 kg (177 lb)  BMI 35.73 kg/m2  SpO2 100%   Total I/O In: -  Out: 600 [Urine:600]  FHT:  FHR: 145 bpm, variability: moderate with periods of min,  accelerations:  Abscent,  decelerations:  Present shallow variables UC:   Every 3-64min SVE:   Dilation: 7.5 Effacement (%): 100 Station: -1 Exam by:: Dr. Reola Sanchez  Labs: Lab Results  Component Value Date   WBC 7.9 01/21/2013   HGB 12.5 01/21/2013   HCT 36.2 01/21/2013   MCV 87.0 01/21/2013   PLT 174 01/21/2013    Assessment / Plan: IOL due to A2DM and progressing slowly as pitocin is off currently due to fetal strip concerns  Labor: AROM performed with return of meconium stained fluid. Cont to monitor and start pit as appropriate.  Fetal Wellbeing:  Category II Pain Control:  Epidural I/D:  n/a  Anticipated MOD:  NSVD  Kirsten Sanchez L 01/22/2013, 1:30 AM

## 2013-01-22 NOTE — Progress Notes (Signed)
Kirsten Sanchez is a 37 y.o. [phone] at [redacted]w[redacted]d admitted for induction of labor due to Gestational diabetes.  Subjective:  Pt with improved fetal tracing since stopping pitocin.  Contractions mild. She is still having good FM.   Objective: BP 110/55  Pulse 66  Temp(Src) 97.7 F (36.5 C) (Oral)  Resp 20  Ht 4\' 11"  (1.499 m)  Wt 80.287 kg (177 lb)  BMI 35.73 kg/m2  SpO2 100% I/O last 3 completed shifts: In: -  Out: 800 [Urine:800]    FHT:  FHR: 145 bpm, variability: moderate with short periods of min,  accelerations:  Abscent,  decelerations:  Present occasional variables and shallow lates but intermittently UC:   MVU of 50-100 off pit SVE:   Dilation: 8.5 Effacement (%): 100 Station: 0 Exam by:: F. Morris, RNC  Labs: Lab Results  Component Value Date   WBC 7.9 01/21/2013   HGB 12.5 01/21/2013   HCT 36.2 01/21/2013   MCV 87.0 01/21/2013   PLT 174 01/21/2013    Assessment / Plan: protracted active phase with inadequate contractions.   Labor: fetal tracing improved. restart pitocin 1/1 Fetal Wellbeing:  Category II Pain Control:  Epidural I/D:  n/a Anticipated MOD:  patient aware and discussed through interpreter that we need to get her contractions strong enough to change her cervix and if we cannot due to fetal intolerance, we may need to discuss c-section  Luismiguel Lamere L 01/22/2013, 7:31 AM

## 2013-01-22 NOTE — Progress Notes (Signed)
Dr.'s Reola Calkins & Cowart @ bedside, FHR tracing reviewed.  Ordered received to discontinue Pitocin

## 2013-01-22 NOTE — Progress Notes (Signed)
Dr.'s Reola Calkins & Cowart informed FHR tracing displaying late decel.  Both MD's in room to see pt.  FHR tracing reviewed by both MD's.

## 2013-01-22 NOTE — Progress Notes (Signed)
Interpreter at bedside.  Discussing plan of care with pt and FOB.  Pt desires C/S.  Answered questions for pt and FOB.  Awaiting on attending MD.

## 2013-01-22 NOTE — Progress Notes (Signed)
LABOR PROGRESS NOTE  Kirsten Sanchez is a 37 y.o. [id number] at [redacted]w[redacted]d  admitted for IOL due to A2DM.  Subjective: Pt has no complaints. S/p AROM and IUPC placement. Amnioinfusion given for repetitive variables. FHR improved. Pitocin restarted at 1:45.   Objective: BP 106/62  Pulse 61  Temp(Src) 97.7 F (36.5 C) (Oral)  Resp 20  Ht 4\' 11"  (1.499 m)  Wt 80.287 kg (177 lb)  BMI 35.73 kg/m2  SpO2 100% Total I/O In: -  Out: 600 [Urine:600]  FHT:  FHR: 140 bpm, variability: moderate,  accelerations:  Present,  decelerations:  Present + variable and early decels. One late decel at 2:48.  UC:   regular, every 2-3 minutes GU: Large bloody show, moderate sized clot removed from vaginal vault SVE:   Dilation: 8 Effacement (%): 100 Station: -1 Exam by:: Dr. Brazos Sink Pitocin @ 3 mu/min  Labs: Lab Results  Component Value Date   WBC 7.9 01/21/2013   HGB 12.5 01/21/2013   HCT 36.2 01/21/2013   MCV 87.0 01/21/2013   PLT 174 01/21/2013    Assessment / Plan: IOL due to A2DM, progressing slowly  Labor: Progressing slowly. Contractions are not adequate. Pitocin now restarted, continue to increase per protocol. Fetal position felt to be oblique and asynclitic which is likely contributing to slow progressing.  Fetal Wellbeing:  Category II, improved since amnioinfusion Pain Control:  Epidural Anticipated MOD:  NSVD  Pt seen and examined with Dr. Virgina Norfolk, Bon Dowis, MD 01/22/2013, 2:52 AM

## 2013-01-23 MED ORDER — INFLUENZA VAC SPLIT QUAD 0.5 ML IM SUSP
0.5000 mL | INTRAMUSCULAR | Status: AC
  Administered 2013-01-23: 0.5 mL via INTRAMUSCULAR
  Filled 2013-01-23: qty 0.5

## 2013-01-23 MED ORDER — INFLUENZA VAC SPLIT QUAD 0.5 ML IM SUSP: 0.5000 mL | INTRAMUSCULAR | Status: DC

## 2013-01-23 MED ORDER — IBUPROFEN 600 MG PO TABS: 600.0000 mg | ORAL_TABLET | Freq: Four times a day (QID) | ORAL | Status: AC

## 2013-01-23 NOTE — Discharge Summary (Signed)
Obstetric Discharge Summary Reason for Admission: induction of labor for GDMA2 Prenatal Procedures: none Intrapartum Procedures: vacuum due to fetal distress Postpartum Procedures: none Complications-Operative and Postpartum: 2nd degree perineal laceration Hemoglobin  Date Value Range Status  01/21/2013 12.5  12.0 - 15.0 g/dL Final     HCT  Date Value Range Status  01/21/2013 36.2  36.0 - 46.0 % Final   Ms Kirsten Sanchez is a 37yo [id number] who was admitted at 39.0wks for IOL secondary to [facility]. Her preg has been followed by the [facility] since 13wks and has been remarkable for 1) GDMA2 controlled with Glyburide 7.5mg  q hs 2) increased risk of DS with nl anat scan 3) AMA. Upon admission on 10/30 her cx was ripened with Cytotec and then Pitocin was started later during the day. The active phase of labor was protracted mostly secondary to fetal intolerance of adequate Pitocin/contractions. Eventually by the morning of 10/31 the pt became completely dilated and was pushing at which point the fetal tracing necessitated more expedient delivery and the Kiwi vacuum was used. Today the pt desires d/c home. She is breastfeeding and desires a LARC.  Physical Exam:  General: alert, cooperative and no distress Heart: RRR Lungs: nl effort Lochia: appropriate Uterine Fundus: firm DVT Evaluation: No evidence of DVT seen on physical exam.  Discharge Diagnoses: Term Pregnancy-delivered  Discharge Information: Date: 01/23/2013 Activity: pelvic rest Diet: routine Medications: PNV and Ibuprofen Condition: stable Instructions: refer to practice specific booklet Discharge to: home Follow-up Information   Follow up with [facility]. (Make a postpartum appointment for 4-6 weeks.)    Contact information:   [address] [place] [address] [phone]      Newborn Data: Live born female  Birth Weight: 7 lb 7.1 oz (3375 g) APGAR: 9, 9  Home with mother.  [name] 01/23/2013, 7:33  AM

## 2013-01-24 NOTE — Discharge Summary (Signed)
Attestation of Attending Supervision of Advanced Practitioner (CNM/NP): Evaluation and management procedures were performed by the Advanced Practitioner under my supervision and collaboration.  I have reviewed the Advanced Practitioner's note and chart, and I agree with the management and plan.  HARRAWAY-SMITH, Venus Gilles 9:17 AM

## 2013-01-26 NOTE — Progress Notes (Signed)
Post discharge chart review completed.  

## 2013-01-31 NOTE — Progress Notes (Signed)
NST reactive on

## 2013-01-31 NOTE — Progress Notes (Signed)
NST reactive on 01/14/13

## 2013-02-04 ENCOUNTER — Encounter: Payer: Self-pay | Admitting: *Deleted

## 2014-01-24 ENCOUNTER — Encounter (HOSPITAL_COMMUNITY): Payer: Self-pay

## 2014-08-10 IMAGING — US US OB COMP LESS 14 WK
1 series · 14 of 20 positions shown · non-contrast
Comparison: none

[Series 1: us ob comp less 14 wks · 14 of 20 slices shown]
[im 1/20]
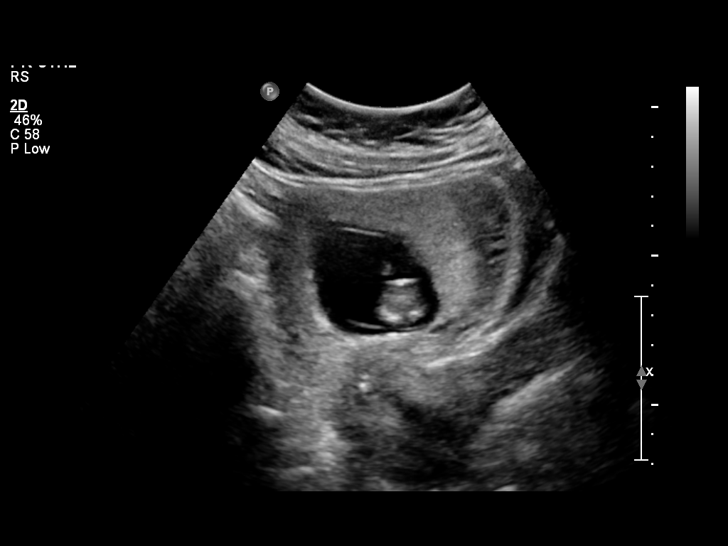
[im 3/20]
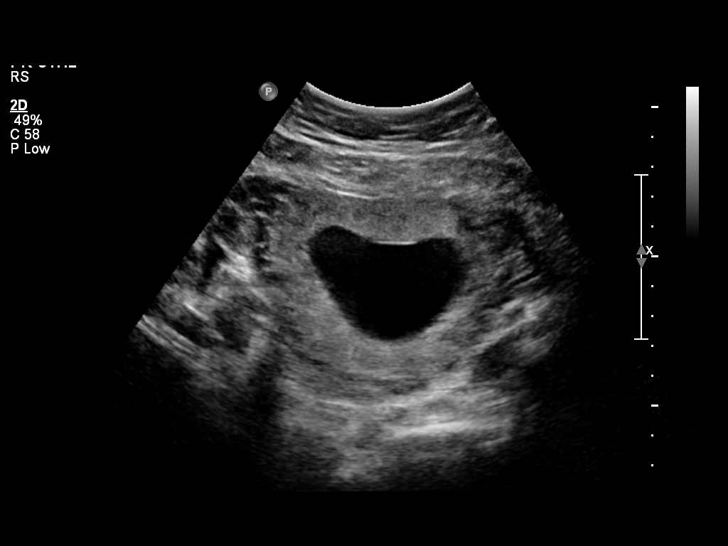
[im 4/20]
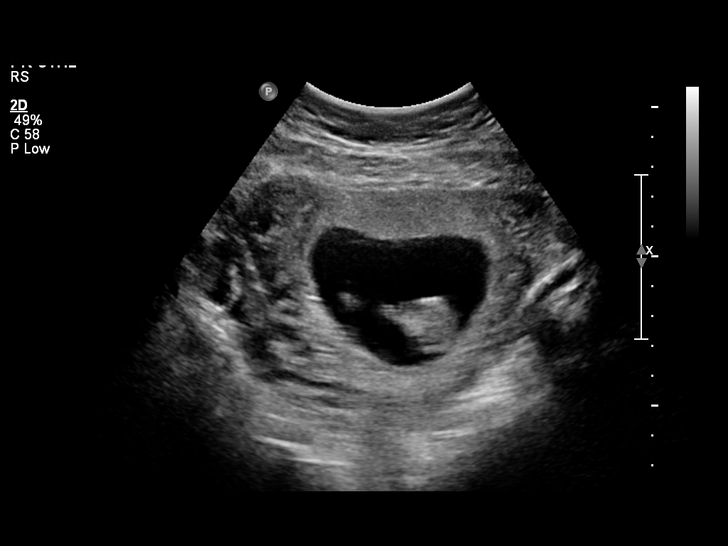
[im 6/20]
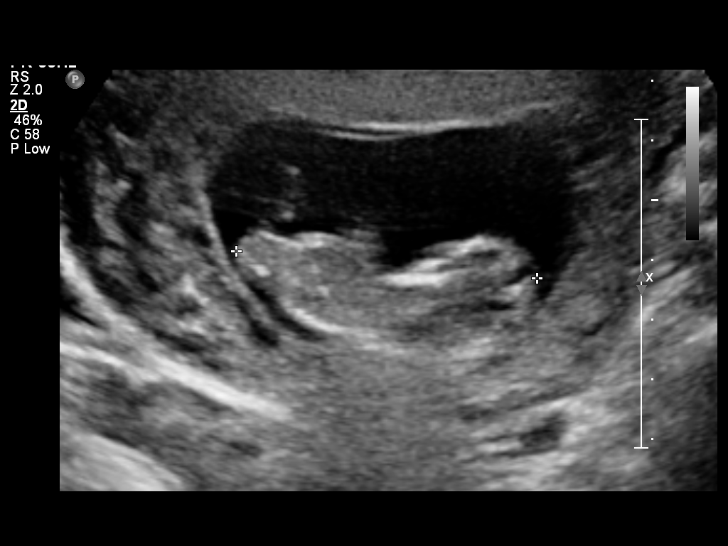
[im 7/20]
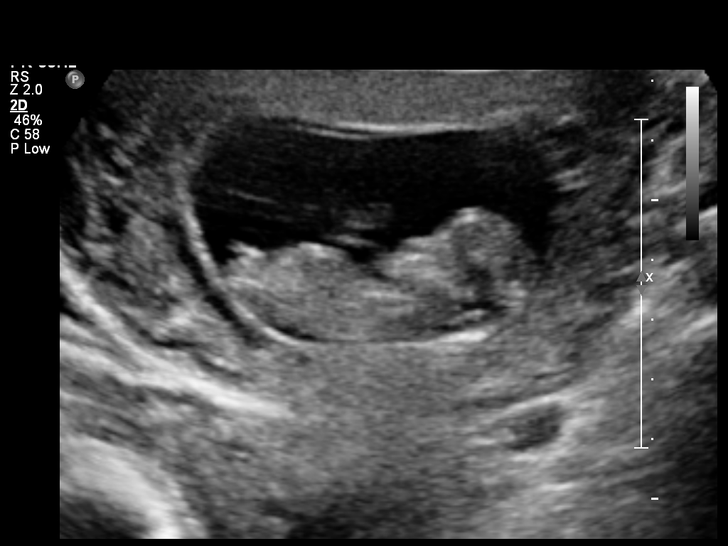
[im 8/20]
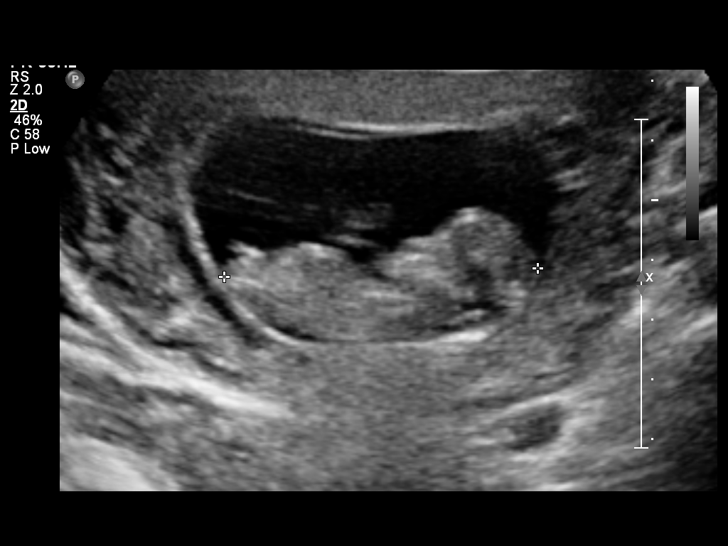
[im 10/20]
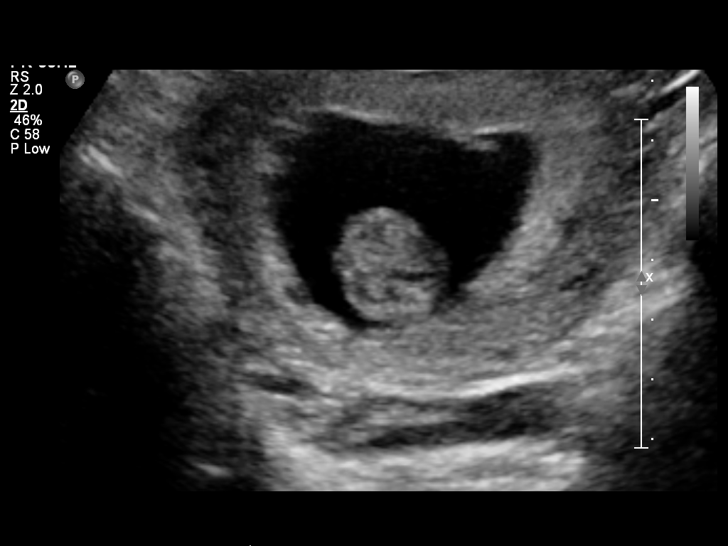
[im 11/20]
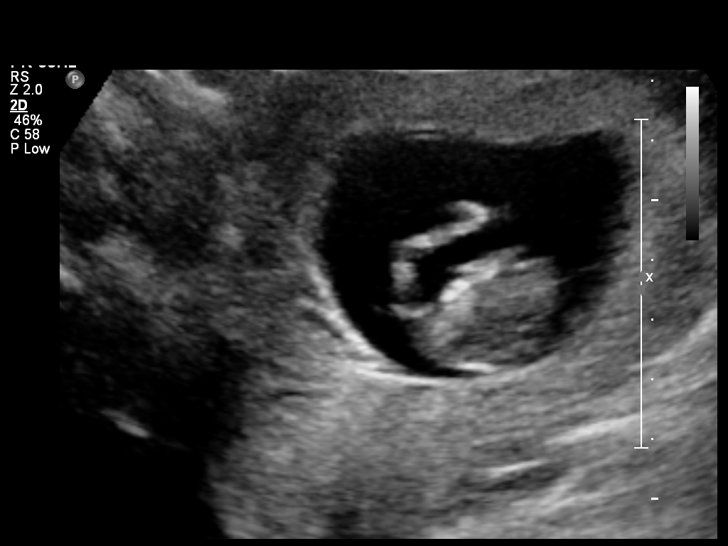
[im 13/20]
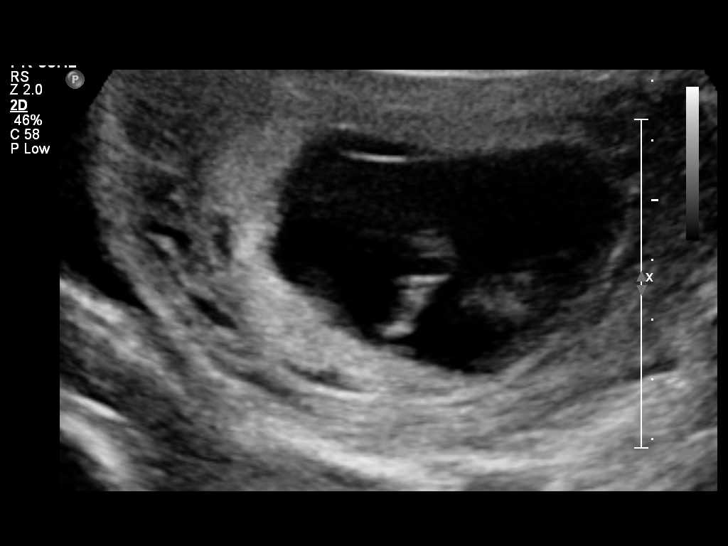
[im 14/20]
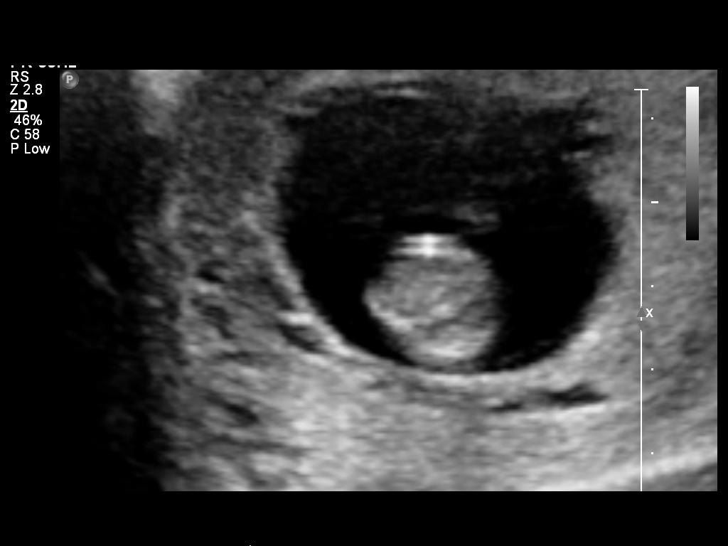
[im 16/20]
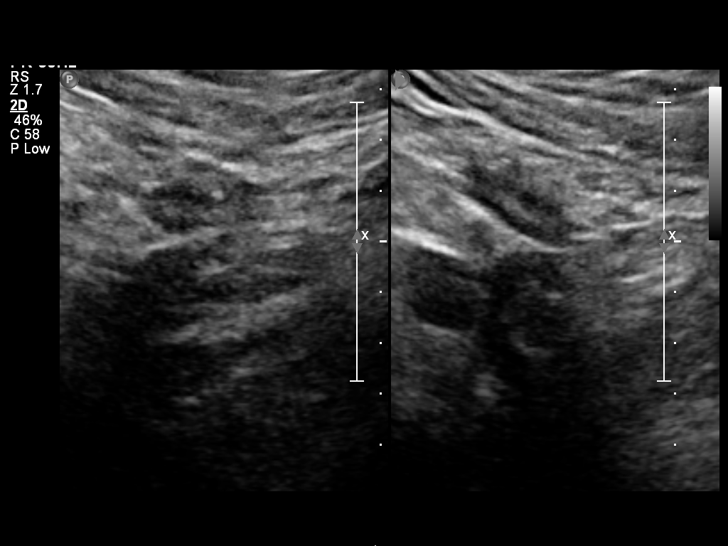
[im 17/20]
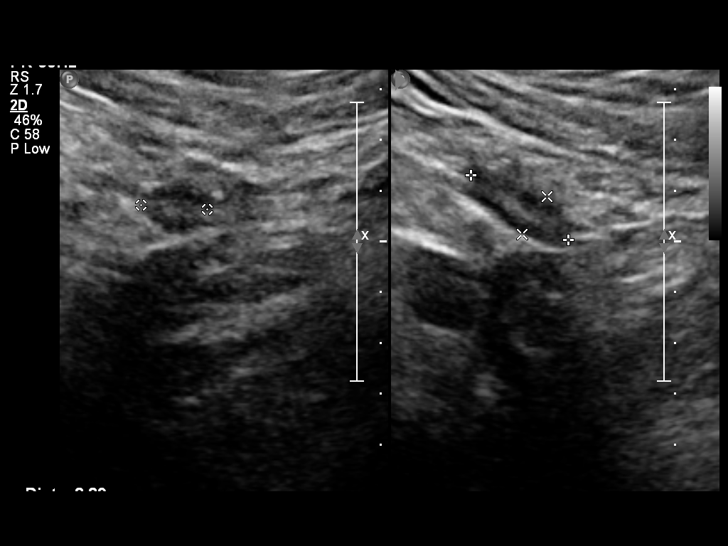
[im 18/20]
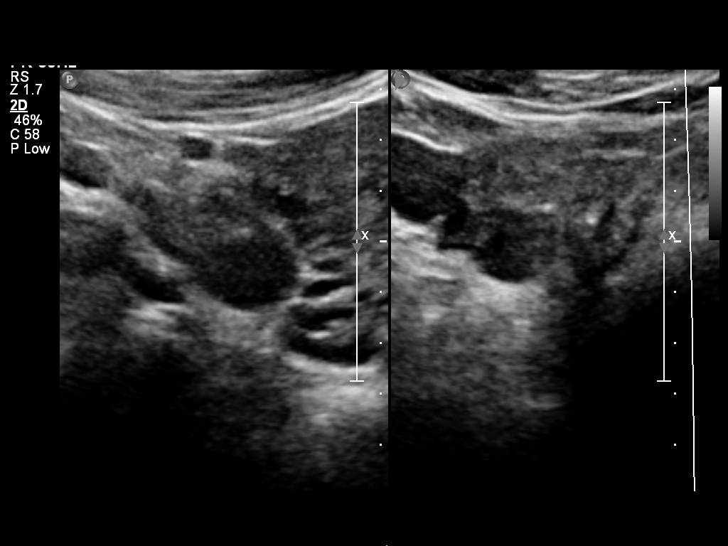
[im 20/20]
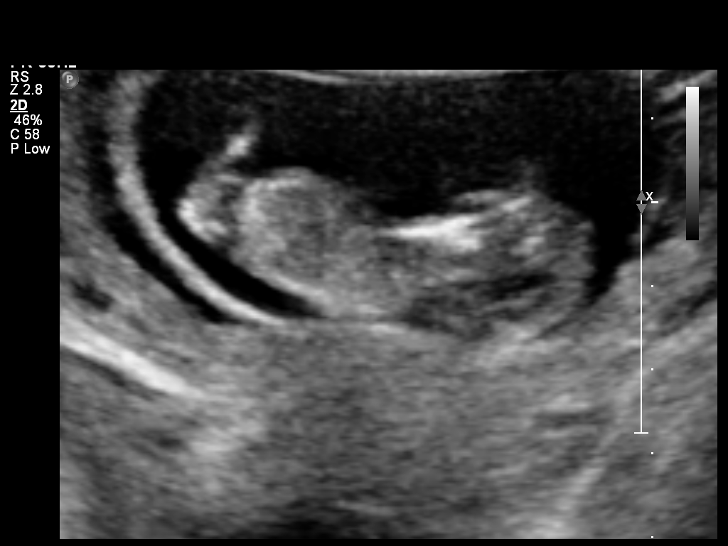

[14 of 20 positions shown; findings below may reference images not displayed]

OBSTETRICS REPORT
                      (Signed Final 07/14/2012 [DATE])

Service(s) Provided

 US OB COMP LESS 14 WKS                                76801.0
Indications

 Unsure of LMP;  Establish Gestational [AGE]
Fetal Evaluation

 Num Of Fetuses:    1
 Preg. Location:    Intrauterine
 Gest. Sac:         Intrauterine
 Yolk Sac:          Not visualized
 Fetal Pole:        Visualized
 Fetal Heart Rate:  165                         bpm
 Cardiac Activity:  Observed
Biometry

 CRL:     51.4  mm    G. Age:   11w 5d                 EDD:   01/28/13
Gestational Age

 Best:          11w 5d    Det. By:   U/S C R L (07/14/12)     EDD:   01/28/13
Cervix Uterus Adnexa

 Cervix:       Normal appearance by transabdominal scan.
 Uterus:       No abnormality visualized.
 Cul De Sac:   No free fluid seen.
 Left Ovary:   Within normal limits.
 Right Ovary:  Within normal limits.
 Adnexa:     No abnormality visualized.
Impression

 Single living IUP with US Gest. Age of 11w 5d, and EDD of
 01/28/2013.
 No significant maternal uterine or adnexal abnormality
 identified.

## 2017-09-09 ENCOUNTER — Emergency Department (HOSPITAL_COMMUNITY): Payer: Self-pay

## 2017-09-09 ENCOUNTER — Emergency Department (HOSPITAL_COMMUNITY)
Admission: EM | Admit: 2017-09-09 | Discharge: 2017-09-09 | Disposition: A | Discharge status: Home / Self Care | Payer: Self-pay | Attending: Emergency Medicine | Admitting: Emergency Medicine

## 2017-09-09 ENCOUNTER — Encounter (HOSPITAL_COMMUNITY): Payer: Self-pay | Admitting: Emergency Medicine

## 2017-09-09 DIAGNOSIS — Y9389 Activity, other specified: Secondary | ICD-10-CM | POA: Insufficient documentation

## 2017-09-09 DIAGNOSIS — R079 Chest pain, unspecified: Secondary | ICD-10-CM | POA: Insufficient documentation

## 2017-09-09 DIAGNOSIS — Y999 Unspecified external cause status: Secondary | ICD-10-CM | POA: Insufficient documentation

## 2017-09-09 DIAGNOSIS — Y92410 Unspecified street and highway as the place of occurrence of the external cause: Secondary | ICD-10-CM | POA: Insufficient documentation

## 2017-09-09 DIAGNOSIS — H5789 Other specified disorders of eye and adnexa: Secondary | ICD-10-CM | POA: Insufficient documentation

## 2017-09-09 DIAGNOSIS — Z79899 Other long term (current) drug therapy: Secondary | ICD-10-CM | POA: Insufficient documentation

## 2017-09-09 MED ORDER — TETRACAINE HCL 0.5 % OP SOLN
1.0000 [drp] | Freq: Once | OPHTHALMIC | Status: AC
  Administered 2017-09-09: 1 [drp] via OPHTHALMIC
  Filled 2017-09-09: qty 4

## 2017-09-09 MED ORDER — FLUORESCEIN SODIUM 1 MG OP STRP
1.0000 | ORAL_STRIP | Freq: Once | OPHTHALMIC | Status: AC
  Administered 2017-09-09: 1 via OPHTHALMIC
  Filled 2017-09-09: qty 1
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
## 2017-09-09 NOTE — ED Notes (Signed)
Patient transported to x-ray. ?
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
## 2017-09-09 NOTE — ED Notes (Signed)
Lamp and meds at bedside

## 2017-09-09 NOTE — ED Notes (Signed)
Pt alert and oriented in NAD. Pt verbalized understanding of discharge instructions. 
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
## 2017-09-09 NOTE — ED Notes (Signed)
Per PA flushed pt eyes with NS.
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
